# Patient Record
Sex: Male | Born: 1945 | Race: White | Hispanic: No | Marital: Married | Smoking: Never smoker
Health system: Southern US, Community
[De-identification: ages and names within clinical notes are randomized; demographics above are authoritative.]

## PROBLEM LIST (undated history)

## (undated) DIAGNOSIS — Z45018 Encounter for adjustment and management of other part of cardiac pacemaker: Secondary | ICD-10-CM

## (undated) DIAGNOSIS — I10 Essential (primary) hypertension: Secondary | ICD-10-CM

## (undated) DIAGNOSIS — Z950 Presence of cardiac pacemaker: Secondary | ICD-10-CM

## (undated) DIAGNOSIS — I480 Paroxysmal atrial fibrillation: Secondary | ICD-10-CM

## (undated) DIAGNOSIS — I495 Sick sinus syndrome: Secondary | ICD-10-CM

## (undated) DIAGNOSIS — M1990 Unspecified osteoarthritis, unspecified site: Secondary | ICD-10-CM

## (undated) HISTORY — DX: Sick sinus syndrome: I49.5

## (undated) HISTORY — DX: Presence of cardiac pacemaker: Z95.0

## (undated) HISTORY — DX: Paroxysmal atrial fibrillation: I48.0

## (undated) HISTORY — DX: Encounter for adjustment and management of other part of cardiac pacemaker: Z45.018

---

## 2000-10-04 ENCOUNTER — Encounter: Admission: RE | Admit: 2000-10-04 | Discharge: 2000-10-04 | Payer: Self-pay | Admitting: Otolaryngology

## 2000-10-04 ENCOUNTER — Encounter: Payer: Self-pay | Admitting: Otolaryngology

## 2003-03-15 ENCOUNTER — Encounter: Admission: RE | Admit: 2003-03-15 | Discharge: 2003-06-13 | Payer: Self-pay | Admitting: Family Medicine

## 2003-07-06 ENCOUNTER — Encounter: Admission: RE | Admit: 2003-07-06 | Discharge: 2003-10-04 | Payer: Self-pay | Admitting: Family Medicine

## 2004-02-04 ENCOUNTER — Ambulatory Visit (HOSPITAL_COMMUNITY): Admission: RE | Admit: 2004-02-04 | Discharge: 2004-02-04 | Payer: Self-pay | Admitting: Gastroenterology

## 2006-06-16 HISTORY — PX: JOINT REPLACEMENT: SHX530

## 2006-07-16 ENCOUNTER — Inpatient Hospital Stay (HOSPITAL_COMMUNITY): Admission: RE | Admit: 2006-07-16 | Discharge: 2006-07-19 | Payer: Self-pay | Admitting: Orthopedic Surgery

## 2006-08-27 ENCOUNTER — Observation Stay (HOSPITAL_COMMUNITY): Admission: RE | Admit: 2006-08-27 | Discharge: 2006-08-28 | Payer: Self-pay | Admitting: Orthopedic Surgery

## 2010-07-21 ENCOUNTER — Ambulatory Visit (HOSPITAL_COMMUNITY): Admission: RE | Admit: 2010-07-21 | Discharge: 2010-07-21 | Payer: Self-pay | Admitting: Orthopedic Surgery

## 2011-01-07 ENCOUNTER — Encounter: Payer: Self-pay | Admitting: Orthopedic Surgery

## 2011-02-08 ENCOUNTER — Other Ambulatory Visit: Payer: Self-pay | Admitting: Orthopedic Surgery

## 2011-02-08 ENCOUNTER — Other Ambulatory Visit (HOSPITAL_COMMUNITY): Payer: Self-pay | Admitting: Orthopedic Surgery

## 2011-02-08 ENCOUNTER — Ambulatory Visit (HOSPITAL_COMMUNITY)
Admission: RE | Admit: 2011-02-08 | Discharge: 2011-02-08 | Disposition: A | Discharge status: Home / Self Care | Payer: BC Managed Care – PPO | Source: Ambulatory Visit | Attending: Orthopedic Surgery | Admitting: Orthopedic Surgery

## 2011-02-08 ENCOUNTER — Encounter (HOSPITAL_COMMUNITY): Payer: BC Managed Care – PPO

## 2011-02-08 DIAGNOSIS — Y831 Surgical operation with implant of artificial internal device as the cause of abnormal reaction of the patient, or of later complication, without mention of misadventure at the time of the procedure: Secondary | ICD-10-CM | POA: Insufficient documentation

## 2011-02-08 DIAGNOSIS — Z01818 Encounter for other preprocedural examination: Secondary | ICD-10-CM

## 2011-02-08 DIAGNOSIS — I10 Essential (primary) hypertension: Secondary | ICD-10-CM | POA: Insufficient documentation

## 2011-02-08 DIAGNOSIS — Z79899 Other long term (current) drug therapy: Secondary | ICD-10-CM | POA: Insufficient documentation

## 2011-02-08 DIAGNOSIS — T84099A Other mechanical complication of unspecified internal joint prosthesis, initial encounter: Secondary | ICD-10-CM | POA: Insufficient documentation

## 2011-02-08 DIAGNOSIS — E119 Type 2 diabetes mellitus without complications: Secondary | ICD-10-CM | POA: Insufficient documentation

## 2011-02-08 DIAGNOSIS — Z01812 Encounter for preprocedural laboratory examination: Secondary | ICD-10-CM | POA: Insufficient documentation

## 2011-02-08 LAB — URINALYSIS, ROUTINE W REFLEX MICROSCOPIC
Bilirubin Urine: NEGATIVE
Hgb urine dipstick: NEGATIVE
Nitrite: NEGATIVE
Protein, ur: NEGATIVE mg/dL
Specific Gravity, Urine: 1.018 (ref 1.005–1.030)
Urine Glucose, Fasting: NEGATIVE mg/dL
Urobilinogen, UA: 0.2 mg/dL (ref 0.0–1.0)
pH: 5.5 (ref 5.0–8.0)

## 2011-02-08 LAB — CBC
HCT: 40.6 % (ref 39.0–52.0)
Hemoglobin: 13.9 g/dL (ref 13.0–17.0)
MCH: 30.1 pg (ref 26.0–34.0)
MCHC: 34.2 g/dL (ref 30.0–36.0)
MCV: 87.9 fL (ref 78.0–100.0)
Platelets: 162 10*3/uL (ref 150–400)
RBC: 4.62 MIL/uL (ref 4.22–5.81)
RDW: 12.6 % (ref 11.5–15.5)
WBC: 4 10*3/uL (ref 4.0–10.5)

## 2011-02-08 LAB — COMPREHENSIVE METABOLIC PANEL
AST: 20 U/L (ref 0–37)
CO2: 25 mEq/L (ref 19–32)
Calcium: 9.4 mg/dL (ref 8.4–10.5)
Chloride: 106 mEq/L (ref 96–112)
Creatinine, Ser: 1.07 mg/dL (ref 0.4–1.5)
GFR calc Af Amer: >60 mL/min (ref >60)
GFR calc non Af Amer: >60 mL/min (ref >60)
Glucose, Bld: 99 mg/dL (ref 70–99)
Total Bilirubin: 1 mg/dL (ref 0.3–1.2)

## 2011-02-08 LAB — PROTIME-INR: Prothrombin Time: 14.2 seconds (ref 11.6–15.2)

## 2011-02-08 LAB — SURGICAL PCR SCREEN: Staphylococcus aureus: INVALID — AB

## 2011-02-12 LAB — MRSA CULTURE

## 2011-02-14 DIAGNOSIS — I10 Essential (primary) hypertension: Secondary | ICD-10-CM | POA: Diagnosis present

## 2011-02-14 DIAGNOSIS — Z96659 Presence of unspecified artificial knee joint: Secondary | ICD-10-CM

## 2011-02-14 DIAGNOSIS — T84039A Mechanical loosening of unspecified internal prosthetic joint, initial encounter: Principal | ICD-10-CM | POA: Diagnosis present

## 2011-02-14 DIAGNOSIS — Y831 Surgical operation with implant of artificial internal device as the cause of abnormal reaction of the patient, or of later complication, without mention of misadventure at the time of the procedure: Secondary | ICD-10-CM | POA: Diagnosis present

## 2011-02-14 DIAGNOSIS — E119 Type 2 diabetes mellitus without complications: Secondary | ICD-10-CM | POA: Diagnosis present

## 2011-02-14 LAB — GRAM STAIN

## 2011-02-14 LAB — TYPE AND SCREEN
ABO/RH(D): A POS
Antibody Screen: NEGATIVE

## 2011-02-14 LAB — ABO/RH: ABO/RH(D): A POS

## 2011-02-14 LAB — GLUCOSE, CAPILLARY: Glucose-Capillary: 176 mg/dL — ABNORMAL HIGH (ref 70–99)

## 2011-02-15 LAB — CBC
MCH: 29.8 pg (ref 26.0–34.0)
MCV: 87.9 fL (ref 78.0–100.0)
Platelets: 175 10*3/uL (ref 150–400)
RDW: 12.3 % (ref 11.5–15.5)

## 2011-02-15 LAB — BASIC METABOLIC PANEL
BUN: 14 mg/dL (ref 6–23)
Calcium: 8.2 mg/dL — ABNORMAL LOW (ref 8.4–10.5)
Creatinine, Ser: 1.04 mg/dL (ref 0.4–1.5)
GFR calc Af Amer: >60 mL/min (ref >60)
GFR calc non Af Amer: >60 mL/min (ref >60)

## 2011-02-16 LAB — BASIC METABOLIC PANEL
Chloride: 102 mEq/L (ref 96–112)
Creatinine, Ser: 1.04 mg/dL (ref 0.4–1.5)
GFR calc Af Amer: >60 mL/min (ref >60)
Potassium: 4.9 mEq/L (ref 3.5–5.1)
Sodium: 136 mEq/L (ref 135–145)

## 2011-02-16 LAB — CBC
MCH: 29.7 pg (ref 26.0–34.0)
Platelets: 153 10*3/uL (ref 150–400)
RBC: 3.57 MIL/uL — ABNORMAL LOW (ref 4.22–5.81)
WBC: 9.6 10*3/uL (ref 4.0–10.5)

## 2011-02-17 LAB — BODY FLUID CULTURE

## 2011-02-17 LAB — CBC
MCV: 89 fL (ref 78.0–100.0)
Platelets: 168 10*3/uL (ref 150–400)
RBC: 3.54 MIL/uL — ABNORMAL LOW (ref 4.22–5.81)
WBC: 7.8 10*3/uL (ref 4.0–10.5)

## 2011-02-19 LAB — ANAEROBIC CULTURE

## 2011-02-22 NOTE — Op Note (Signed)
NAME:  [name], [name] NO.:  [id number]  MEDICAL RECORD NO.:  [id number]           PATIENT TYPE:  I  LOCATION:  0005                         FACILITY:  [facility]  PHYSICIAN:  Ollen Gross, M.D.    DATE OF BIRTH:  [birth year]  DATE OF PROCEDURE:  02/14/2011 DATE OF DISCHARGE:                              OPERATIVE REPORT   PREOPERATIVE DIAGNOSIS:  Unstable right total knee arthroplasty.  POSTOPERATIVE DIAGNOSIS:  Unstable right total knee arthroplasty.  PROCEDURE:  Right total knee arthroplasty revision.  SURGEON:  Ollen Gross, M.D.  ASSISTANT:  Alexzandrew L. Perkins, P.A.C.  ANESTHESIA:  General.  ESTIMATED BLOOD LOSS:  Minimal.  DRAIN:  Hemovac x1.  TOURNIQUET TIME:  53 minutes at 300 mmHg, down 10 minutes, up additional 17 minutes at 300 mmHg.  COMPLICATIONS:  None.  CONDITION:  Stable to recovery room.  CLINICAL NOTE:  [name] is a 65 year old male with an unstable right total knee arthroplasty which is also extremely stiff.  There is flexion of only about 70 degrees.  He has had a CBC, sed rate and C- reactive protein which were within normal limits.  He has had a bone scan showing loosening of the components.  He presents now for revision of the polyethylene versus total knee arthroplasty revision.  PROCEDURE IN DETAIL:  After successful administration of general anesthetic, a tourniquet was placed high on his right thigh and his right lower extremity was prepped and draped in the usual sterile fashion.  Extremity was wrapped in Esmarch.  Tourniquet inflated to 300 mmHg.  Midline incision was made with 10 blade through subcutaneous tissue to the level of the extensor mechanism.  Fresh blade was used to make a medial parapatellar arthrotomy.  Minimal fluid was encountered and this fluid was sent for stat Gram stain which was negative.  I excised a lot of scar from under the extensor mechanism both medially and laterally.  I then elevated  the soft tissue off the proximal medial tibia to the joint line with a knife and into the semimembranosus bursa with a Cobb elevator.  Laterally, he was scarred down quite a bit, and I removed most of the fat pad.  Patella still would not even sublux.  I had to do quadriceps snip in order to even get the patella to sublux.  I performed that and then subluxed the patella laterally.  His polyethylene showed a fair amount of wear in the intercondylar region. I felt that the overall clinical picture was not consistent with one that would respond just to polyethylene revision as he was loose in extension and tight in flexion.  We removed the tibial polyethylene.  I then inspected the femoral component and there appeared to be some gap between the component and the bone.  I then used an osteotome to disrupt the interface between the component bone and easily removed the component with minimal if any bone loss.  There was a large lytic area in the intercondylar notch.  I removed the membrane from this, and it really expanded the intercondylar notch quite a bit.  Fortunately there was no evidence of  any segmental bone loss.  We then subluxed the tibia forward and gained exposure to the proximal tibia.  I used an oscillating saw to disrupt the interface between the tibial component bone and tibial components and easily removed with no bone loss.  I removed the cement from the tibial canal.  The canals were both thoroughly irrigated and then reamed on the femoral side up to 20 mm, tibial side 13 mm.  Retractors were again placed around the tibia and then the extramedullary tibial cutting guide was placed.  I removed about 2 mm from the tibial surface to make it a flat surface again.  We then prepared with the sleeve, up to 37-mm sleeve. The proximal tibia was then prepared with the modular drill and keel punch.  A trial size 5 MBT revision tray with a 13 x 30 stem extension and 37 sleeve was  placed with excellent purchase.  I then prepared the femur.  A 20-mm reamer was placed to serve as intramedullary cutting guide.  Distal femoral cutting block was placed to remove minimal bone from the distal femur.  I had to go up in a +4 position and thus we used 4-mm distal augments both medial and lateral. We then placed the size 5 cutting block.  Rotation was marked by creating a rectangular flexion gap at 90 degrees with spacer block.  We placed the stem in a +2 position to effectively raise the stem and lower the flange of the prosthesis down to the anterior cortex of the femur. The anterior, posterior and chamfer cuts were made.  We had to use 4-mm augments posteriorly both medial and lateral.  Intercondylar blocks were placed and cuts made with TC3.  The trial was then placed which was a size 5 TC3 femur with 18 x 75 stem extension and +2 position, 4 mm augments medial and lateral distally and posteriorly.  Trial had excellent fit.  We placed a 17.5-mm insert.  There was a little bit of play with 17.5, went to 20 which had great stability with full extension and excellent varus-valgus and anterior-posterior balance throughout full range of motion.  The patella was intact but there was lot of soft tissue covering it.  I essentially did patelloplasty to remove the soft tissue and allow for the component to track within the trochlea.  It tracked normally.  Tourniquet was then let down for initial tourniquet time of 53 minutes.  We held it down for 10 minutes while the components were assembled on the back table.  Once the components were assembled, then we rewrapped the extremity in Esmarch and reinflated tourniquet to 300 mmHg.  I then removed the trials and the size 5 cement restrictor was most appropriate for the tibial canal.  The size 5 cement restrictor was placed and then the cut bone surfaces were prepared with pulsatile lavage.  Cement was mixed and once ready for  implantation, the gentamicin impregnated cement was injected into the tibial canal and the tibial component was placed.  This was a size 5 MBT revision tray with a 13 x 30 stem extension and 37 sleeve.  We then impacted the femoral component which was a size 5 TC3 femur with 20 x 75 stem extension and +2 position with 4-mm augments medial and lateral both distal and posterior.  Once it impacted, then we placed a 20-mm trial insert.  Full extension was achieved and the knee was held in full extension.  All extruded cement removed.  When  cement was fully hardened, then the permanent 20-mm TC3 tibial polyethylene was placed.  This was a Hydrographic surveyor.  Wound was then copiously irrigated with saline solution and then the arthrotomy and quad snip closed over the Hemovac drain with interrupted #1 PDS.  Flexion against gravity was about 115 degrees. Tourniquet was then released.  Second tourniquet time was 17 minutes. Subcu was closed with interrupted 2-0 Vicryl and skin with staples. Catheter for the Marcaine pain pump was placed and the pump was initiated.  Incisions were cleaned and dried.  A bulky sterile dressing applied.  Drains hooked to suction.  He was placed into a knee immobilizer, awakened and transported to recovery in stable condition.     Ollen Gross, M.D.     FA/MEDQ  D:  02/14/2011  T:  02/14/2011  Job:  [id number]  Electronically Signed by Ollen Gross M.D. on 02/21/2011 03:47:08 PM

## 2011-03-07 NOTE — H&P (Signed)
NAME:  [name], [name] NO.:  [id number]  MEDICAL RECORD NO.:  [id number]          PATIENT TYPE:  INP  LOCATION:                               FACILITY:  [facility]  PHYSICIAN:  Ollen Gross, M.D.    DATE OF BIRTH:  1946-07-18  DATE OF ADMISSION: DATE OF DISCHARGE:                             HISTORY & PHYSICAL   DATE OF ADMISSION:  February 14, 2011.  CHIEF COMPLAINT:  Right knee pain.  HISTORY OF PRESENT ILLNESS:  The patient is a 65 year old male who has been seen by Dr. Lequita Halt for problems ongoing with his right knee, had a right total knee done about 4 and half years ago.  Unfortunately, he continues to have pain and problems with the right knee.  He was seen in the second opinion this past July, 2011.  He initially said he did well after his total knee replacement but then started having problems.  He was sent to the hospital for possible infection and received antibiotics for this.  Over time unfortunately, the knee has progressively gotten worse and developed stiffness.  He has been seen in the office and found to have x-ray showing a well fixed DePuy rotating platform.  He has a little bit of lucency on the posterior aspect of femoral condyles and tiny area underneath the tibial tray, is questionable whether these are progressive.  He possibly could have a low-grade infection versus loosening due to increased stress on the implant.  It is felt at this point he would benefit from undergoing revision versus resection.  Risks and benefits have been discussed.  He elected to proceed with surgery.  ALLERGIES:  No known drug allergies.  CURRENT MEDICATIONS:  Lipitor, lisinopril, vitamin D, baby aspirin, multivitamin, fish oil, glucosamine chondroitin, turmeric and lysine.  PAST MEDICAL HISTORY: 1. Hypertension. 2. Diet-controlled diabetes mellitus.  PREVIOUS SURGERIES:  Right total knee arthroplasty, July 16, 2006.  FAMILY HISTORY:  Father with heart  disease, hypertension.  Mother with heart disease, cancer and hypertension.  SOCIAL HISTORY:  Married, retired, nonsmoker.  Rare intake of wine. Three children.  His wife will be assisting with care after surgery.  He does have 3 steps entering his home and does have healthcare power of attorney.  REVIEW OF SYSTEMS:  GENERAL:  No fevers, chills or night sweats.  NEURO: No seizures, syncope or paralysis.  RESPIRATORY:  No shortness breath, productive cough or hemoptysis.  CARDIOVASCULAR:  No chest pain or orthopnea.  GI:  No nausea, vomiting, diarrhea, or constipation.  GU: No dysuria, hematuria, or discharge.  MUSCULOSKELETAL:  Right knee.  PHYSICAL EXAMINATION:  VITAL SIGNS:  Pulse ranging about 56-60, respirations 12, blood pressure 138/72. GENERAL:  A 65 year old tall frame white male well-nourished, well- developed, in no acute distress.  He is alert, oriented and cooperative. Good historian. HEENT:  Normocephalic, atraumatic.  Pupils are round and reactive.  EOMs intact.  Does have reading glasses on.  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 NECK:  Supple. CHEST:  Clear. HEART:  Bradycardic rhythm, otherwise regular  S1 and S2 noted. ABDOMEN:  Soft, slightly round.  Bowel sounds present. RECTAL, BREASTS, GENITALIA:  Not done, not pertinent to present illness. EXTREMITIES:  Right lower extremity, slight effusion with the knee endpoints.  Range of motion is 5 to 85 with firm end point.  No instability or antalgic gait.  IMPRESSION:  Failure right total knee arthroplasty.  PLAN:  Revision versus resection right total knee arthroplasty.  Surgery will be performed by Dr. Ollen Gross.     Alexzandrew L. Julien Girt, P.A.C.   ______________________________ Ollen Gross, M.D.    ALP/MEDQ  D:  02/13/2011  T:  02/13/2011  Job:  [id number]  cc:   Marjory Lies, M.D. Fax: [phone]  Electronically Signed by Patrica Duel  P.A.C. on 02/22/2011 10:36:18 AM Electronically Signed by Ollen Gross M.D. on 03/07/2011 08:17:22 AM

## 2011-03-21 NOTE — Discharge Summary (Signed)
NAME:  [name], [name] NO.:  [id number]  MEDICAL RECORD NO.:  [id number]           PATIENT TYPE:  I  LOCATION:  1612                         FACILITY:  [facility]  PHYSICIAN:  Ollen Gross, M.D.    DATE OF BIRTH:  1946/11/22  DATE OF ADMISSION:  02/14/2011 DATE OF DISCHARGE:  02/17/2011                              DISCHARGE SUMMARY   ADMITTING DIAGNOSES: 1. Failure right total knee arthroplasty. 2. Hypertension. 3. Diet-controlled diabetes mellitus.  DISCHARGE DIAGNOSES: 1. Unstable failed right total knee arthroplasty, status post revision     right total knee. 2. Postop hyponatremia. 3. Hypertension. 4. Diet-controlled diabetes mellitus.  PROCEDURE:  February 14, 2011, right total knee arthroplasty revision. Surgeon, Ollen Gross, M.D.  Assistant, Alexzandrew L. Perkins, P.A.C. Anesthesia, general.  Tourniquet time, 53 minutes and down for 10 minutes, up an additional 17 minutes.  CONSULTS:  None.  BRIEF HISTORY:  The patient is a 65 year old male with unstable right total knee arthroplasty, extremely stiff.  He has had flexion only about 70 degrees.  Laboratory workup for infection has been within normal limits.  Bone scan did show loosening though and he presents now for a polyethylene revision versus total knee revision.  LABORATORY DATA:  CBC on admission showed a hemoglobin of 13.9, hematocrit of 40.6, white cell count 4.0, platelets 162.  PT/INR 14.2 and 1.08 with a PTT of 30.  Chem panel on admission all within normal limits.  Preop UA, trace ketones, otherwise negative.  Nasal swabs were negative for Staph aureus, negative for MRSA.  Blood group type A+. Serial CBCs were followed.  Hemoglobin dropped down to 11 and 10.6. Last hemoglobin 10.4 and hematocrit 31.5.  Serial BMETs were followed. Sodium did drop down to 133, back up to 136; glucose went up to 186, back down to 140.  Remaining electrolytes remained within normal limits. Stat Gram  stain taken at the time of surgery, no organisms, moderate WBCs.  Fluid culture taken.  Smear showed no organisms, no growth. Anaerobic culture smear showed no organism, no anaerobes isolated.  HOSPITAL COURSE:  The patient admitted to [facility], taken to OR, underwent above-stated procedure without complication.  The patient tolerated procedure well, later transferred to the recovery room from the orthopedic floor, started on p.o. and IV analgesic for pain control following surgery, doing pretty well on the morning of day 1.  He was tolerating his medications.  We discontinued the IV fluids and put him on Xarelto protocol for DVT prophylaxis.  By day 2, he was progressing up with therapy.  Hemoglobin was stable.  Dressing was changed, incision looked good, pressure looked good, and continue to progress well with therapy and by day 3, he was tolerating his medications, meeting his goals and discharged home.  DISCHARGE PLAN: 1. The patient discharged home on February 17, 2011. 2. Discharge diagnoses, please see above. 3. Discharge medications, Robaxin, Percocet, Xarelto.  Continue home     medications of aspirin, Lipitor, and lisinopril.  DIET:  Heart-healthy diet.  ACTIVITY:  Weightbearing as tolerated, total hip protocol.  DISPOSITION:  Home.  CONDITION ON DISCHARGE:  Improved.  Alexzandrew L. Julien Girt, P.A.C.   ______________________________ Ollen Gross, M.D.    ALP/MEDQ  D:  03/15/2011  T:  03/16/2011  Job:  [id number]  cc:   Marjory Lies, M.D. Fax: [phone]  Electronically Signed by Patrica Duel P.A.C. on 03/21/2011 07:19:01 AM Electronically Signed by Ollen Gross M.D. on 03/21/2011 09:53:39 AM

## 2011-03-30 IMAGING — CR DG CHEST 2V
2 series · 2 of 2 positions shown · non-contrast
Comparison: 07/09/2006

CLINICAL DATA: Preop right total knee arthroplasty.

CHEST - 2 VIEW

[w chest pa]
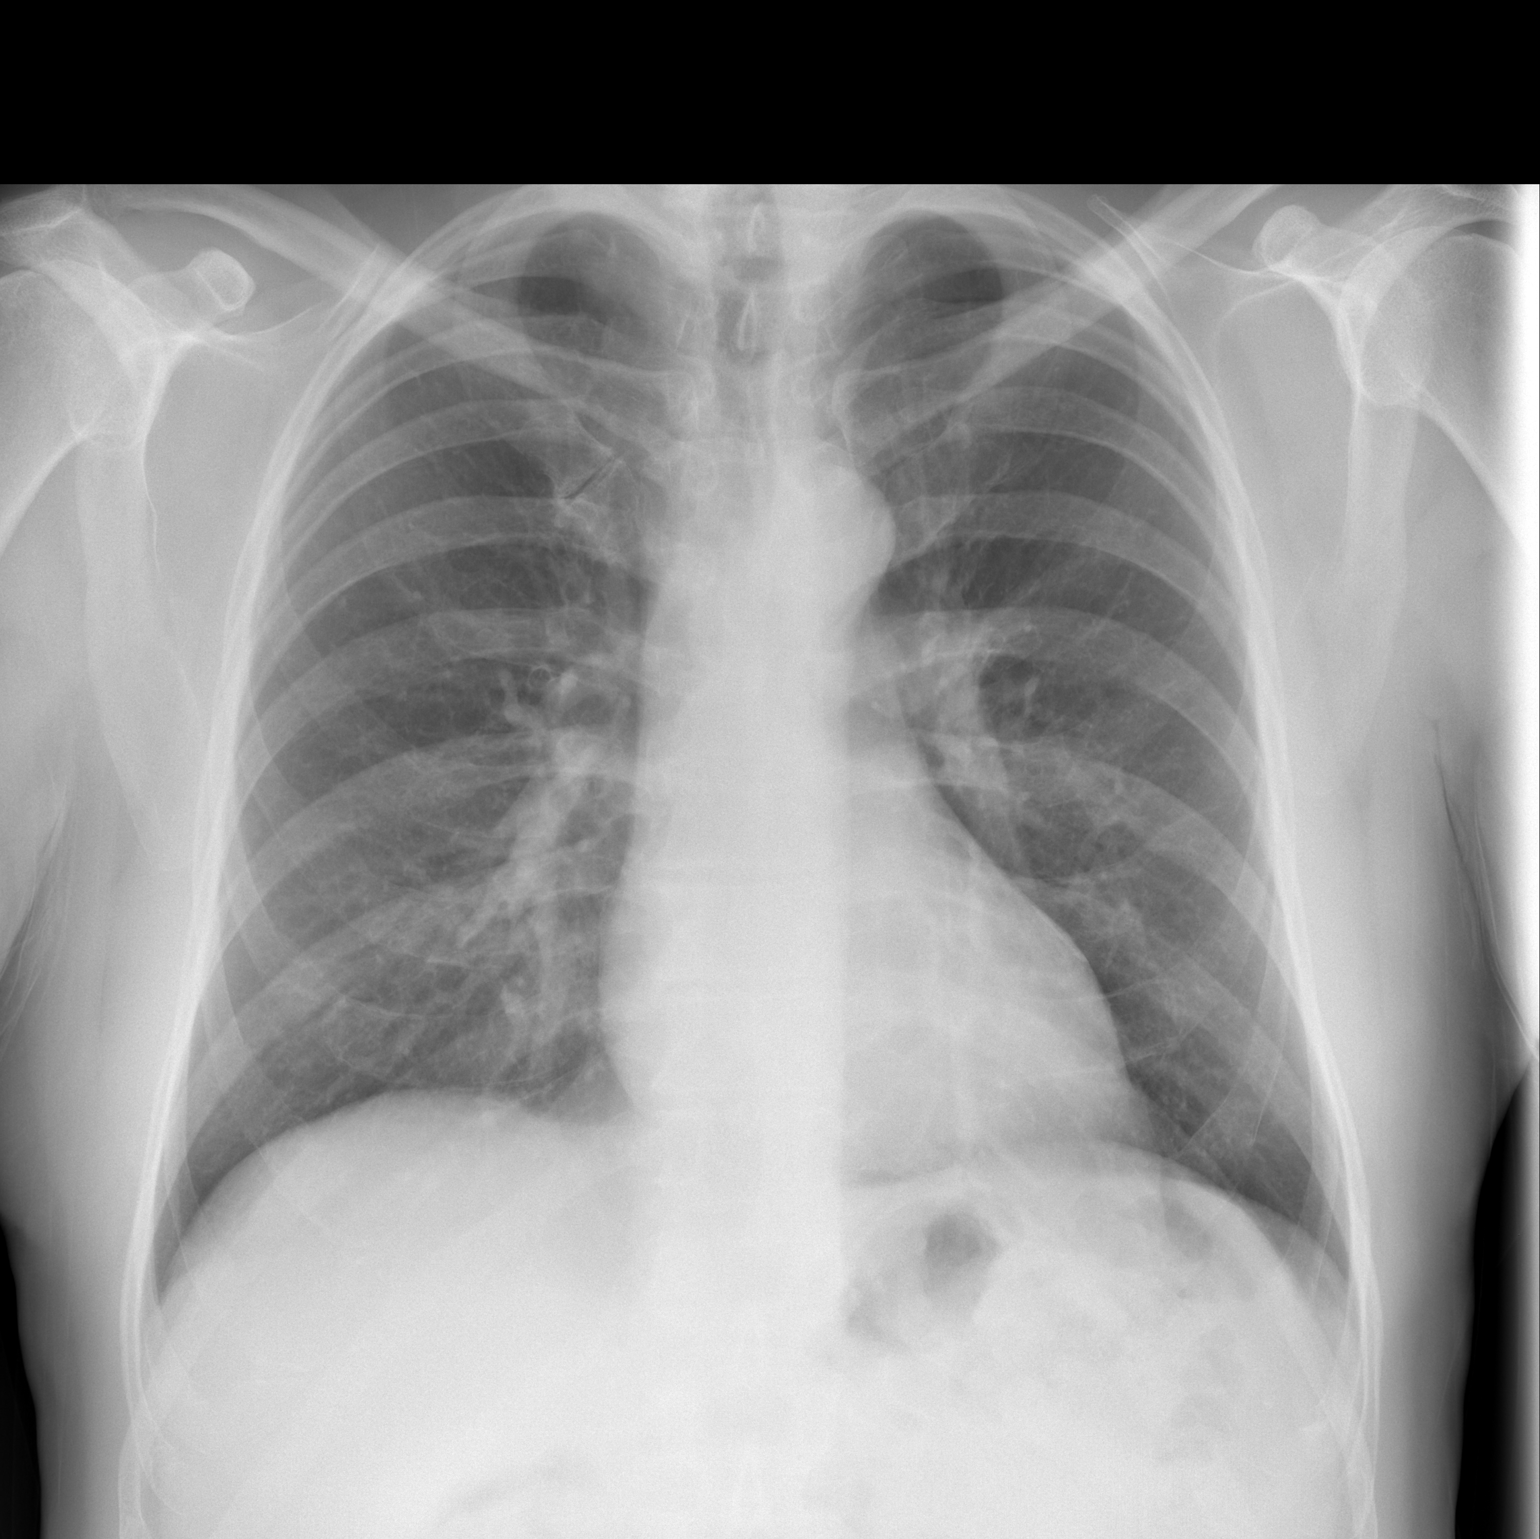

[w chest lat]
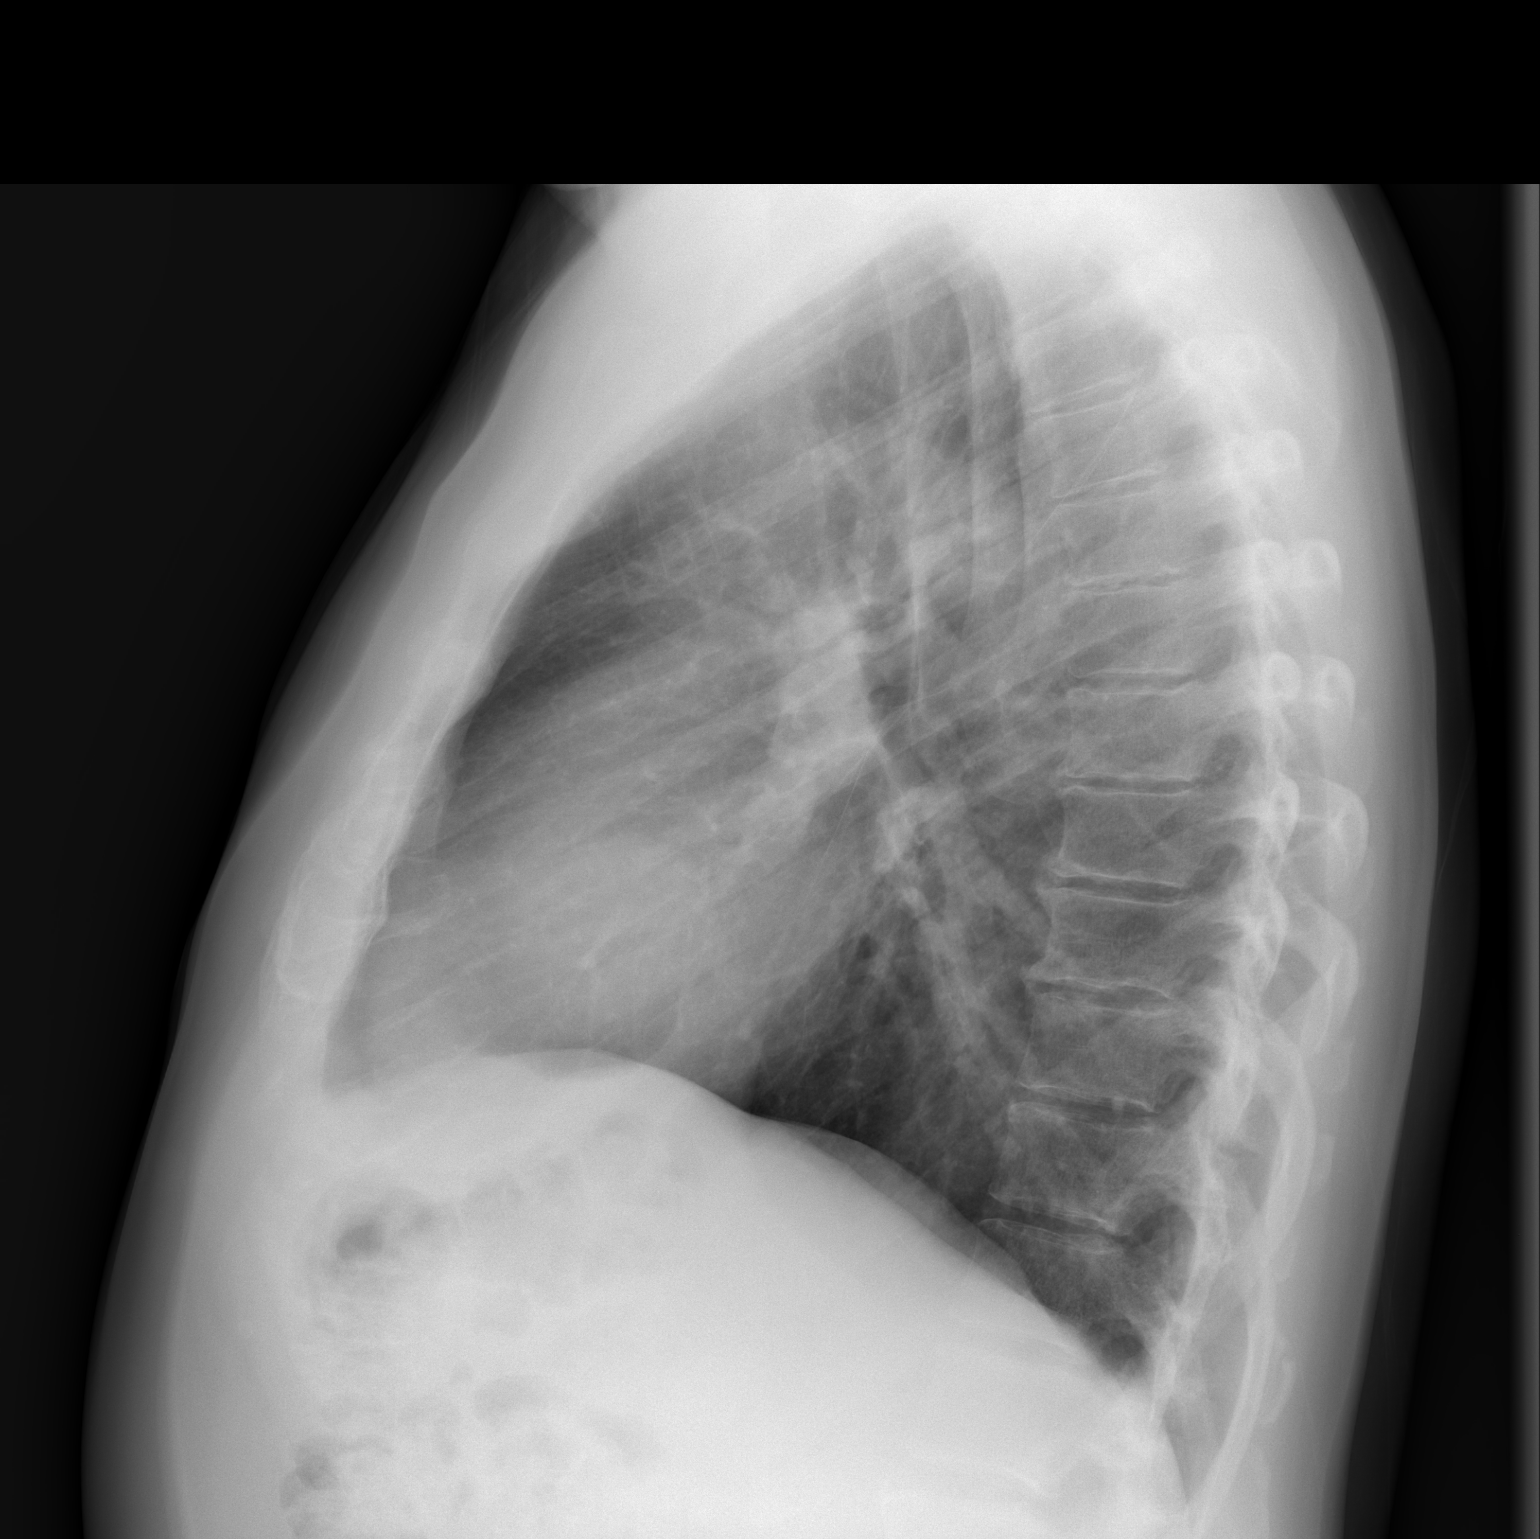

[2 of 2 positions shown; findings below may reference images not displayed]

FINDINGS: Lungs are clear.

No pleural effusion or pneumothorax.

Cardiomediastinal silhouette is within normal limits.

Visualized osseous structures are within normal limits.
IMPRESSION: No evidence of acute cardiopulmonary disease.

## 2011-05-04 NOTE — Op Note (Signed)
NAME:  [name], [name] NO.:  [id number]   MEDICAL RECORD NO.:  [id number]                   PATIENT TYPE:  AMB   LOCATION:  ENDO                                 FACILITY:  [facility]   PHYSICIAN:  Anselmo Rod, M.D.               DATE OF BIRTH:  February 22, 1946   DATE OF PROCEDURE:  02/04/2004  DATE OF DISCHARGE:                                 OPERATIVE REPORT   PROCEDURE:  Screening colonoscopy.   ENDOSCOPIST:  Charna Elizabeth, M.D.   INSTRUMENT USED:  Olympus video colonoscope.   INDICATIONS FOR PROCEDURE:  65 year old white male with a family history of  colon cancer in his father is undergoing screening colonoscopy to rule out  colonic polyps, masses, etc.   PREPROCEDURE PREPARATION:  Informed consent was obtained from the patient.  The patient was fasted for eight hours prior to the procedure and prepped  with a bottle of magnesium citrate and a gallon of GoLYTELY the night prior  to the procedure.   PREPROCEDURE PHYSICAL:  Patient with stable vital signs.  Neck supple.  Chest clear to auscultation.  S1 and S2 regular.  Abdomen soft with normal  bowel sounds.   DESCRIPTION OF PROCEDURE:  The patient was placed in the left lateral  decubitus position, sedated with 50 mg of Demerol and 6 mg Versed  intravenously.  Once the patient was adequately sedated, maintained on low  flow oxygen and continuous cardiac monitoring, the Olympus video colonoscope  was advanced from the rectum to the cecum.  There was some residual stool in  the colon, multiple washings were done, a few early sigmoid diverticula were  seen, small internal hemorrhoids were noted, no other masses or polyps were  identified.   IMPRESSION:  1. Early sigmoid diverticulosis.  2. Small internal hemorrhoids.  3. No masses or polyps seen.   RECOMMENDATIONS:  1. Continue a high fiber diet with liberal fluid intake.  2. Repeat CRC screening in the next five years unless the patient  develops     any abnormal symptoms in the interim.  3. Outpatient follow up as the need arises in the future.                                               Anselmo Rod, M.D.    JNM/MEDQ  D:  02/04/2004  T:  02/05/2004  Job:  [id number]   cc:   Duwayne Heck L. Mahaffey, M.D.  [address]  [place]  [place] [address]  Fax: [phone]

## 2011-05-04 NOTE — Discharge Summary (Signed)
NAME:  [name], [name] NO.:  [id number]   MEDICAL RECORD NO.:  [id number]          PATIENT TYPE:  INP   LOCATION:  5034                         FACILITY:  [facility]   PHYSICIAN:  Elana Alm. Thurston Hole, M.D. 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  2. Hypertension.  3. High cholesterol.  4. Diet controlled diabetes.   DISCHARGE DIAGNOSIS:  1. 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  2. Hypertension.  3. High cholesterol.  4. Diet controlled diabetes.   HISTORY OF PRESENT ILLNESS:  The patient is a 65 year old white male with a  history of end stage DJD of his right knee.  He has failed conservative care  including anti-inflammatories and physical therapy.  He understands the  risks, benefits, and possible complications of a right total knee  replacement and is without question.   PROCEDURES IN HOUSE:  On July 16, 2006, the patient underwent a right total  knee replacement by Dr. Thurston Hole and a right femoral nerve block by  anesthesia.   HOSPITAL COURSE:  He was admitted postoperatively for pain control, physical  therapy and DVT prophylaxis. On postop day zero, he had a moderate amount of  drainage from his hemostatic and, therefore, it was reclamped. Postop day  one, hemoglobin 11.7, CBG 135, he is metabolically stable with a moderate  amount of drainage.  He tolerated CPM 0 to 90 degrees with minimal pain.  His dressing was changed.  His Foley was discontinued.  His PCA was  discontinued.  He was started on Percocet for pain.  On postop day two, his  is approximately a 3, T-max of 101.1, temperature on exam was 98.9, vital  signs were stable.  Hemoglobin and 9.8.  INR is 1.9.  He has a small amount  of sanguinous drainage on his dressing.  He has positive bowel sounds.  His  lungs  were clear.  He is tolerating 0 to 90 on the CPM. He is  neurovascularly intact with a brisk capillary refill. Intact sensation and  motor functions.  His fever of 101 improved with incentive she spirometry  and Tylenol. His IV was saline locked and TED hose were applied.  Postop day  three, T-max of 100.4, hemoglobin 9.6, INR is 2.  He is metabolically  stable.  Surgical wound is well-approximated.  He does have a slight bit of  redness.  CPM series 0 to 90 degrees.  He has brisk capillary refill. He is  being discharged to home in stable condition, weight bearing as tolerated,  on his regular diabetic diet.   DISCHARGE MEDICATIONS:  Percocet 1-2 q.4-6h. p.r.n. pain, doxycycline 100 mg  1 tablet twice a day for ten days, Coumadin 5 mg 1 tablet daily, Lisinopril  20 mg 1 tablet daily, aspirin 81 mg 1 tablet daily, Lipitor 20 mg 1 tablet  daily, Colace 100 mg 1 tablet twice a day, Senokot-S 2 tablets before  dinner.   DISCHARGE INSTRUCTIONS:  He has been instructed to use his CPM 0 to 90  degrees eight hours a day, elevate his right knee on a folded pillow 30  minutes a day.  He will get home health physical therapy, occupational  therapy, and nursing.  He will follow up with Dr. Thurston Hole on August 14.  He  will call with increased redness, increased swelling, increased drainage,  increased pain or temperature greater than 101.      Kirstin Shepperson, P.A.      Robert A. Thurston Hole, M.D.  Electronically Signed    KS/MEDQ  D:  08/21/2006  T:  08/21/2006  Job:  [id number]   cc:   Duwayne Heck L. Mahaffey, M.D.

## 2011-05-04 NOTE — Op Note (Signed)
NAME:  [name], [name] NO.:  [id number]   MEDICAL RECORD NO.:  [id number]          PATIENT TYPE:  INP   LOCATION:  2550                         FACILITY:  [facility]   PHYSICIAN:  Robert A. Thurston Hole, M.D. DATE OF BIRTH:  [birth year]   DATE OF PROCEDURE:  07/16/2006  DATE OF DISCHARGE:                                 OPERATIVE REPORT   PREOPERATIVE DIAGNOSIS:  His right knee degenerative joint disease.   POSTOPERATIVE DIAGNOSIS:  His right knee degenerative joint disease.   PROCEDURE:  Right total knee replacement using DePuy cemented total knee  system with #5 cemented femur, #5 cemented tibia with 10 mm polyethylene RP  tibial spacer, and 38 mm polyethylene cemented patella.   SURGEON:  Dr. Salvatore Marvel   ASSISTANT:  Julien Girt, PA   ANESTHESIA:  General.   OPERATIVE TIME:  1 hour 40 minutes.   COMPLICATIONS:  None.   DESCRIPTION OF PROCEDURE:  Mr. [name] was brought to operating room on  July 16, 2006, after a femoral nerve block was placed in the holding room by  anesthesia.  He is placed on the operative table supine position.  He  received Ancef 1 g IV preoperatively for prophylaxis.  After being placed  under general anesthesia, he had a Foley catheter placed under sterile  conditions.  His right knee was examined.  Range of motion from -5 to 125  degrees with varus deformity and a stable ligamentous exam with normal  patellar tracking.  The right leg was prepped using sterile DuraPrep and  draped using sterile technique.  Leg was exsanguinated and a thigh  tourniquet elevated 365 mmHg.  Initially through a 15 cm longitudinal  incision based over the patella, initial exposure was made.  Subcutaneous tissues were incised in line with the skin incision.  A median  arthrotomy was performed revealing an excessive amount of normal-appearing  joint fluid.  The articular surfaces were inspected.  He had grade 4 changes  medially, grade 3 and 4 changes  laterally, and grade 3 and 4 four changes in  the patellofemoral joint.  Osteophytes were removed from the femoral  condyles and tibial plateau.  The medial and lateral meniscal remnants were  removed as well as the anterior cruciate ligament.  An intramedullary drill  was then drilled up the femoral canal for placement of the distal femoral  cutting jig which was placed in the appropriate amount of rotation, and the  distal 12 mm cut was made.  The distal femur was incised.  A #5 was found to  be the appropriate size.  A #5 cutting jig was placed, and then these cuts  were made.  Proximal tibia was then exposed.  The tibial spines were removed  with an oscillating saw.  Intramedullary drill was drilled down the tibial  canal for placement of the proximal tibial cutting jig which was placed in  the appropriate amount of rotation, and a proximal 8 mm cut was made based  off the medial or lower side.  Spacer blocks were then placed, 10 mm block  in flexion and extension,  found to give excellent balancing, excellent  stability, and excellent correction of his flexion and varus deformities.  At this point, the #5 tibial baseplate was placed on the cut tibial surface,  and in the keel cut was made.  A #5 PCL box cutter was placed on the distal  femur, and then these cuts were made.  At this point, the #5 femoral trial  was placed and with the #5 tibial base plate trial, a 10 mm polyethylene RP  tibial spacer was placed on tibial baseplate.  The knee reduced, taken  through range of motion from 0-125 degrees with excellent stability and  excellent correction of his flexion and varus deformities.  At this point,  the patella was sized.  A resurfacing 10 mm cut was made, and 3 locking  holes were placed for a 38 mm patella.  The trial was placed.  Patellofemoral tracking was evaluated and this was found to be normal.  At  this point, it was felt that all the components were of excellent size, fit,   and stability.  They were then removed.  The knee was then jet lavage  irrigated with 3 liters of saline solution.  The proximal tibia was then  exposed, and the #5 tibial baseplate with cement backing was hammered into  position with an excellent fit with excess cement being removed from around  the edges.  #5 femoral component with cement backing was hammered into  position also with an excellent fit with excess cement being removed around  the edges.  The 10 mm polyethylene RP tibial spacer was placed on the tibial  baseplate and the knee reduced, taken through a range of motion from 0-125  degrees with excellent stability and excellent correction of his flexion and  varus deformities.  A 38 mm polyethylene cement backed patella was then  placed in its position and held there with a clamp.  After the cement  hardened, patellofemoral tracking was again evaluated.  This was found to be  normal.  At this point, it was felt that all components were excellent size,  fit, and stability.  The knee was further irrigated with saline, then the  tourniquet was released.  Hemostasis was obtained with cautery.  The  arthrotomy was then closed with #1 Ethilon suture over 2 medium Hemovac  drains.  Subcutaneous tissues closed with 0 and 2-0 Vicryl, subcuticular  layer closed with 3-0 Monocryl.  Steri-Strips were applied.  Sterile  dressings were applied.  The Hemovac injected with 0.25% Marcaine with  epinephrine and 4 mg morphine and clamp.  The patient then awakened,  extubated, and taken to the recovery room in stable condition.  Needle and  sponge counts were correct x2 at the end of the case.      Robert A. Thurston Hole, M.D.  Electronically Signed     RAW/MEDQ  D:  07/16/2006  T:  07/16/2006  Job:  [id number]

## 2011-05-09 ENCOUNTER — Ambulatory Visit (HOSPITAL_BASED_OUTPATIENT_CLINIC_OR_DEPARTMENT_OTHER)
Admission: RE | Admit: 2011-05-09 | Discharge: 2011-05-09 | Disposition: A | Discharge status: Home / Self Care | Payer: Medicare Other | Source: Ambulatory Visit | Attending: Orthopedic Surgery | Admitting: Orthopedic Surgery

## 2011-05-09 DIAGNOSIS — T8489XA Other specified complication of internal orthopedic prosthetic devices, implants and grafts, initial encounter: Secondary | ICD-10-CM | POA: Insufficient documentation

## 2011-05-09 DIAGNOSIS — Y831 Surgical operation with implant of artificial internal device as the cause of abnormal reaction of the patient, or of later complication, without mention of misadventure at the time of the procedure: Secondary | ICD-10-CM | POA: Insufficient documentation

## 2011-05-09 DIAGNOSIS — Z96659 Presence of unspecified artificial knee joint: Secondary | ICD-10-CM | POA: Insufficient documentation

## 2011-05-09 DIAGNOSIS — E119 Type 2 diabetes mellitus without complications: Secondary | ICD-10-CM | POA: Insufficient documentation

## 2011-05-09 DIAGNOSIS — Z79899 Other long term (current) drug therapy: Secondary | ICD-10-CM | POA: Insufficient documentation

## 2011-05-09 DIAGNOSIS — E785 Hyperlipidemia, unspecified: Secondary | ICD-10-CM | POA: Insufficient documentation

## 2011-05-09 DIAGNOSIS — I10 Essential (primary) hypertension: Secondary | ICD-10-CM | POA: Insufficient documentation

## 2011-05-09 DIAGNOSIS — M24669 Ankylosis, unspecified knee: Secondary | ICD-10-CM | POA: Insufficient documentation

## 2011-05-09 LAB — POCT I-STAT 4, (NA,K, GLUC, HGB,HCT)
Glucose, Bld: 119 mg/dL — ABNORMAL HIGH (ref 70–99)
HCT: 47 % (ref 39.0–52.0)

## 2011-05-19 NOTE — Op Note (Signed)
  NAME:  [name], [name] NO.:  [id number]  MEDICAL RECORD NO.:  [id number]           PATIENT TYPE:  LOCATION:                                 FACILITY:  PHYSICIAN:  Ollen Gross, M.D.    DATE OF BIRTH:  1946-02-05  DATE OF PROCEDURE:  05/09/2011 DATE OF DISCHARGE:                              OPERATIVE REPORT   PREOPERATIVE DIAGNOSIS:  Right knee arthrofibrosis.  POSTOPERATIVE DIAGNOSIS:  Right knee arthrofibrosis.  PROCEDURE:  Right knee closed manipulation.  SURGEON:  Ollen Gross, M.D.  ASSISTANT:  None.  ANESTHESIA:  General.  COMPLICATIONS:  None.  PRE-MANIPULATION RANGE OF MOTION:  5-90.  POST MANIPULATION RANGE OF MOTION:  0-125.  CONDITION:  Stable to Recovery.  BRIEF CLINICAL NOTE:  [name] is a 66 year old male, who had a revision total knee arthroplasty done approximately two and half months ago.  He did well initially, but then he has had plateau in his range of motion with flexion at 90 degrees.  He has worked with physical therapy with inability to get beyond 90.  He presents now for closed manipulation.  PROCEDURE IN DETAIL:  After successful administration of general anesthetic, exam under anesthesia was performed with range of motion 5- 90.  I then placed my chest up against his proximal tibia and flexed the knee with audible lysis of adhesions.  I was able to get all the back to about 125 degrees of flexion.  We were able to obtain full extension.  I also worked on patellar mobility to make the patella more mobile.  We flexed again usually to 125.  He subsequently awakened and transported to Recovery in stable condition.     Ollen Gross, M.D.     FA/MEDQ  D:  05/09/2011  T:  05/09/2011  Job:  [id number]  Electronically Signed by Ollen Gross M.D. on 05/19/2011 04:05:35 PM

## 2011-09-11 ENCOUNTER — Other Ambulatory Visit: Payer: Self-pay | Admitting: Orthopedic Surgery

## 2011-09-11 ENCOUNTER — Encounter (HOSPITAL_COMMUNITY): Payer: Medicare Other

## 2011-09-11 LAB — COMPREHENSIVE METABOLIC PANEL
BUN: 16 mg/dL (ref 6–23)
Calcium: 9.8 mg/dL (ref 8.4–10.5)
Creatinine, Ser: 0.86 mg/dL (ref 0.50–1.35)
GFR calc Af Amer: >60 mL/min (ref >60)
Glucose, Bld: 105 mg/dL — ABNORMAL HIGH (ref 70–99)
Sodium: 138 mEq/L (ref 135–145)
Total Protein: 7.5 g/dL (ref 6.0–8.3)

## 2011-09-11 LAB — SURGICAL PCR SCREEN: Staphylococcus aureus: NEGATIVE

## 2011-09-11 LAB — URINALYSIS, ROUTINE W REFLEX MICROSCOPIC
Leukocytes, UA: NEGATIVE
Nitrite: NEGATIVE
Specific Gravity, Urine: 1.014 (ref 1.005–1.030)
pH: 5.5 (ref 5.0–8.0)

## 2011-09-11 LAB — CBC
MCH: 29.9 pg (ref 26.0–34.0)
MCHC: 34.5 g/dL (ref 30.0–36.0)
Platelets: 192 10*3/uL (ref 150–400)
RBC: 4.92 MIL/uL (ref 4.22–5.81)

## 2011-09-11 LAB — PROTIME-INR: Prothrombin Time: 13.9 seconds (ref 11.6–15.2)

## 2011-09-17 ENCOUNTER — Inpatient Hospital Stay (HOSPITAL_COMMUNITY)
Admission: RE | Admit: 2011-09-17 | Discharge: 2011-09-20 | DRG: 470 | Disposition: A | Discharge status: Home Health | Payer: Medicare Other | Source: Ambulatory Visit | Attending: Orthopedic Surgery | Admitting: Orthopedic Surgery

## 2011-09-17 DIAGNOSIS — Z01812 Encounter for preprocedural laboratory examination: Secondary | ICD-10-CM

## 2011-09-17 DIAGNOSIS — E119 Type 2 diabetes mellitus without complications: Secondary | ICD-10-CM | POA: Diagnosis present

## 2011-09-17 DIAGNOSIS — Z7982 Long term (current) use of aspirin: Secondary | ICD-10-CM

## 2011-09-17 DIAGNOSIS — Z79899 Other long term (current) drug therapy: Secondary | ICD-10-CM

## 2011-09-17 DIAGNOSIS — Z96659 Presence of unspecified artificial knee joint: Secondary | ICD-10-CM

## 2011-09-17 DIAGNOSIS — I10 Essential (primary) hypertension: Secondary | ICD-10-CM | POA: Diagnosis present

## 2011-09-17 DIAGNOSIS — E7800 Pure hypercholesterolemia, unspecified: Secondary | ICD-10-CM | POA: Diagnosis present

## 2011-09-17 DIAGNOSIS — D62 Acute posthemorrhagic anemia: Secondary | ICD-10-CM | POA: Diagnosis not present

## 2011-09-17 DIAGNOSIS — M1710 Unilateral primary osteoarthritis, unspecified knee: Principal | ICD-10-CM | POA: Diagnosis present

## 2011-09-17 DIAGNOSIS — E871 Hypo-osmolality and hyponatremia: Secondary | ICD-10-CM | POA: Diagnosis not present

## 2011-09-17 HISTORY — PX: JOINT REPLACEMENT: SHX530

## 2011-09-17 LAB — GLUCOSE, CAPILLARY
Glucose-Capillary: 121 mg/dL — ABNORMAL HIGH (ref 70–99)
Glucose-Capillary: 146 mg/dL — ABNORMAL HIGH (ref 70–99)

## 2011-09-18 LAB — CBC
HCT: 31.9 % — ABNORMAL LOW (ref 39.0–52.0)
MCV: 87.6 fL (ref 78.0–100.0)
Platelets: 156 10*3/uL (ref 150–400)
RBC: 3.64 MIL/uL — ABNORMAL LOW (ref 4.22–5.81)
WBC: 7 10*3/uL (ref 4.0–10.5)

## 2011-09-18 LAB — BASIC METABOLIC PANEL
CO2: 28 mEq/L (ref 19–32)
Chloride: 100 mEq/L (ref 96–112)
Creatinine, Ser: 0.9 mg/dL (ref 0.50–1.35)
Potassium: 4.6 mEq/L (ref 3.5–5.1)

## 2011-09-19 LAB — CBC
HCT: 26.8 % — ABNORMAL LOW (ref 39.0–52.0)
Hemoglobin: 9.7 g/dL — ABNORMAL LOW (ref 13.0–17.0)
MCH: 31 pg (ref 26.0–34.0)
MCV: 85.6 fL (ref 78.0–100.0)
Platelets: 125 10*3/uL — ABNORMAL LOW (ref 150–400)
RBC: 3.13 MIL/uL — ABNORMAL LOW (ref 4.22–5.81)
WBC: 7 10*3/uL (ref 4.0–10.5)

## 2011-09-19 LAB — BASIC METABOLIC PANEL
CO2: 29 mEq/L (ref 19–32)
Calcium: 8.6 mg/dL (ref 8.4–10.5)
Chloride: 101 mEq/L (ref 96–112)
Glucose, Bld: 143 mg/dL — ABNORMAL HIGH (ref 70–99)
Sodium: 135 mEq/L (ref 135–145)

## 2011-09-20 LAB — CBC
HCT: 24.4 % — ABNORMAL LOW (ref 39.0–52.0)
Hemoglobin: 8.7 g/dL — ABNORMAL LOW (ref 13.0–17.0)
MCH: 30.7 pg (ref 26.0–34.0)
MCHC: 35.7 g/dL (ref 30.0–36.0)
MCV: 86.2 fL (ref 78.0–100.0)
RDW: 12.4 % (ref 11.5–15.5)

## 2011-09-26 NOTE — Discharge Summary (Signed)
NAME:  [name], [name] NO.:  [id number]  MEDICAL RECORD NO.:  [id number]  LOCATION:  1619                         FACILITY:  [facility]  PHYSICIAN:  Ollen Gross, M.D.    DATE OF BIRTH:  28-May-1946  DATE OF ADMISSION:  09/17/2011 DATE OF DISCHARGE:  09/20/2011                              DISCHARGE SUMMARY   ADMITTING DIAGNOSES: 1. Osteoarthritis, left knee. 2. Hypertension. 3. Hypercholesterolemia. 4. Diabetes.  DISCHARGE DIAGNOSES: 1. Osteoarthritis, left knee; status post left total knee replacement     arthroplasty. 2. Postoperative acute blood loss anemia, did not require transfusion. 3. Postoperative hyponatremia, improved. 4. Hypertension. 5. Hypercholesterolemia. 6. Diabetes.  PROCEDURE:  September 17, 2011, left total knee.  SURGEON:  Ollen Gross, M.D.  ASSISTANT:  Alexzandrew L. Perkins, P.A.C.  ANESTHESIA:  General.  TOURNIQUET TIME:  39 minutes.  CONSULTS:  None.  BRIEF HISTORY:  [name] is a 65 year old male with advanced arthritis of the left knee, progressive worsening pain dysfunction, and extensive nonoperative management including analgesics and injections as well as therapy, but this was failed and now presents for total knee arthroplasty.  LABORATORY DATA:  Preop CBC showed hemoglobin of 14.7, hematocrit 42.6, white cell counts 6, and platelets 192.  PT/INR 13.9 and 1.05 with PTT of 30.  Chem panel on admission all within normal limits.  Preop UA is negative.  Blood group type A+.  Nasal swabs were negative for staph aureus and negative for MRSA.  Serial CBCs were followed:  Hemoglobin dropped down to 11.2 then 9.7 on postop day #2 and by day three when he was discharged home, hemoglobin was 8.7 with hematocrit of 24.4.  He did have a drop in his platelets from 192-125, but was already back up to 131 prior to discharge.  BMETs were followed for 48 hours.  Sodium dropped from 138-134 back up to 135.  Glucose went up  105-147, last noted 143 serum level.  X-rays:  Two-view chest dated February 08, 2011, no evidence of acute cardiopulmonary disease.  EKG dated February 08, 2011, marked sinus bradycardia, incomplete right bundle-branch block confirmed by Dr. Marca Ancona.  HOSPITAL COURSE:  The patient was admitted to [facility], taken to OR, underwent above-stated procedure without complication.  The patient tolerated the procedure well, later transferred to recovery room and orthopedic floor, started on p.o. and IV analgesic pain control following surgery given 24 hours postop IV antibiotics, and started on Xarelto for DVT prophylaxis.  He was actually doing pretty well on the morning of day 1 and had decent urinary output.  Sodium was down, felt be a dilutional component but he was diuresing fluids well and we are going to allowing to construct back up on his own.  His meds were restarted for home.  Started to get up out of bed, did fairly well walking over 200 feet by postop day #1 and 2.  On day 2, we changed the dressing and incision looked good.  Hemoglobin was done and 9.7 by then, he was placed on a little low-dose iron, was asymptomatic with this; continued progress well and by day three, he was doing well.  Hemoglobin was 8.7, asymptomatic with  it.  Tolerating his meds and discharged home.  DISCHARGE PLAN: 1. The patient was discharged home on September 20, 2011. 2. Discharge diagnoses:  Please see above. 3. Discharge meds:  Oxycodone, Robaxin, Xarelto, and Nu-Iron.     Continue home meds of Lipitor and lisinopril.  DIET:  Heart-healthy diabetic diet.  ACTIVITY:  Weightbearing as tolerated total protocol.  FOLLOWUP:  2 weeks.  DISPOSITION:  Home.  CONDITION ON DISCHARGE:  Improved.     Alexzandrew L. Julien Girt, P.A.C.   ______________________________ Ollen Gross, M.D.    ALP/MEDQ  D:  09/20/2011  T:  09/20/2011  Job:  [id number]  cc:   Gloriajean Dell. Andrey Campanile, M.D. Fax:  [phone]  Electronically Signed by Patrica Duel P.A.C. on 09/21/2011 09:22:38 AM Electronically Signed by Ollen Gross M.D. on 09/26/2011 11:20:21 AM

## 2011-09-26 NOTE — H&P (Signed)
NAME:  [name], [name] NO.:  [id number]  MEDICAL RECORD NO.:  [id number]  LOCATION:  1619                         FACILITY:  [facility]  PHYSICIAN:  Ollen Gross, M.D.    DATE OF BIRTH:  [birth year]  DATE OF ADMISSION:  09/17/2011                              HISTORY & PHYSICAL   CHIEF COMPLAINT:  Left knee pain.  HISTORY OF PRESENT ILLNESS:  Patient is a 65 year old male who is being seen by Dr. Lequita Halt for ongoing left knee pain.  He is previously well- known to Dr. Lequita Halt and previously undergone revision of previous knee and also manipulation.  The right knee is stable at this point. Actually, he is doing very well and is pleased with his result.  The left knee unfortunately continues to be problematic.  He has known end- stage arthritis with bone-on-bone.  He has had a point now since the right knee is doing much better that he would like to proceed and have the left knee done.  ALLERGIES:  No known drug allergies.  CURRENT MEDICATIONS:  Lisinopril, atorvastatin, aspirin, and a multivitamin.  PAST MEDICAL HISTORY: 1. Hypertension. 2. Hypercholesterolemia. 3. Diabetes mellitus.  PAST SURGICAL HISTORY: 1. Right total knee replacement, 2007. 2. Revision of right total knee replacement in February 2012.  FAMILY HISTORY:  Father deceased at 33.  Mother deceased at 57.  SOCIAL HISTORY:  Married, retired, nonsmoker.  No alcohol.  He does have a caregiver lined up.  He has 3 steps entering his home.  He does have a living will healthcare power of attorney.  REVIEW OF SYSTEMS:  GENERAL:  No fevers, chills, night sweats.  NEURO: No seizures, syncope, or paralysis.  RESPIRATORY:  No shortness of breath, productive cough, or hemoptysis.  CARDIOVASCULAR:  No chest pain or orthopnea.  GI:  No nausea, vomiting, diarrhea, or constipation.  GU: No dysuria, hematuria, or discharge.  MUSCULOSKELETAL:  Left knee.  PHYSICAL EXAMINATION:  VITAL SIGNS:  Pulse is  around 48 to 52, respirations 12, blood pressure 142/78. GENERAL:  65 year old white male, well-nourished, well-developed, tall frame, no acute distress, good historian. HEENT:  Normocephalic, atraumatic.  Pupils are round and reactive.  EOMs intact.  Does wear glasses. NECK:  Supple.  No bruits.  He does have a small lipoma on the right side of his neck. CHEST:  Clear, anterior posterior chest walls. HEART:  Otherwise bradycardic rhythm, pulse is around 50.  No murmurs. S1, S2 noted. ABDOMEN:  Soft, nontender.  Bowel sounds present. RECTAL, BREASTS, GENITALIA:  Not done, not pertinent to present illness. EXTREMITIES:  Left knee, no effusion, slight varus, malalignment deformity, marked crepitus.  Range of motion 5 to 120.  IMPRESSION:  Osteoarthritis, left knee.  PLAN:  Patient will be admitted to [facility] to undergo a left total knee replacement arthroplasty.  Surgery will be performed by Dr. Ollen Gross.     Alexzandrew L. Julien Girt, P.A.C.   ______________________________ Ollen Gross, M.D.  ALP/MEDQ  D:  09/19/2011  T:  09/19/2011  Job:  [id number]  cc:   Gloriajean Dell. Andrey Campanile, M.D. Fax: [phone]  Electronically Signed by Patrica Duel P.A.C. on 09/20/2011 10:58:31 AM Electronically Signed by Ollen Gross  M.D. on 09/26/2011 11:19:49 AM

## 2011-09-26 NOTE — Op Note (Signed)
NAME:  CHRISTIAN, BORGERDING NO.:  [id number]  MEDICAL RECORD NO.:  [id number]  LOCATION:  1619                         FACILITY:  [facility]  PHYSICIAN:  Ollen Gross, M.D.    DATE OF BIRTH:  12-13-1946  DATE OF PROCEDURE:  09/17/2011 DATE OF DISCHARGE:                              OPERATIVE REPORT   PREOPERATIVE DIAGNOSIS:  Osteoarthritis, left knee.  POSTOPERATIVE DIAGNOSIS:  Osteoarthritis, left knee.  PROCEDURE:  Left total knee arthroplasty.  SURGEON:  Ollen Gross, M.D.  ASSISTANT:  Alexzandrew L. Perkins, P.A.C.  ANESTHESIA:  General.  ESTIMATED BLOOD LOSS:  Minimal.  DRAIN:  Hemovac x1.  TOURNIQUET TIME:  39 minutes at 300 mmHg.  COMPLICATIONS:  None.  CONDITION:  Stable to recovery.  BRIEF CLINICAL NOTE:  [name] is a 65 year old male with advanced end-stage arthritis of the left knee with progressively worsening pain and dysfunction.  He has had extensive nonoperative management including injections and analgesics as well as therapy and has failed.  He presents now for a left total knee arthroplasty.  PROCEDURE IN DETAIL:  After successful administration of general anesthetic, a tourniquet was placed on his left thigh and his left lower extremity was prepped and draped in the usual sterile fashion. Extremities wrapped in Esmarch, knee flexed, tourniquet inflated to 300 mmHg.  Midline incision was made with a 10 blade through subcutaneous tissue to the level of the extensor mechanism.  A fresh blade was used to make a medial parapatellar arthrotomy.  Soft tissue over the proximal medial tibia was subperiosteally elevated to the joint line with a knife and into the semimembranosus bursa with a Cobb elevator.  Soft tissue laterally was elevated with attention being paid to avoid the patellar tendon on the tibial tubercle.  The patella was everted, knee flexed 90 degrees, an ACL and PCL removed.  He had severe erosive bone-on-bone change in  the medial and patellofemoral compartments with grooving of his tibia.  A drill was used to create a starting hole in the distal femur and the canal was thoroughly irrigated.  The 5 degree left valgus alignment guide was placed and a distal femoral cutting block pinned to remove 11 mm off the distal femur.  Resection was made with an oscillating saw.  Tibia subluxed forward and the menisci removed.  Extramedullary tibial alignment guide was placed referencing proximally at the medial aspect of the tibial tubercle and distally along the second metatarsal axis and tibial crest.  The block was pinned to remove 2 mm off the more deficient medial side.  Tibial resection was made with an oscillating saw.  Size 4 was the most appropriate tibial component and the proximal tibia is prepared with a modular drill and keel punch for the size 4.  Femoral sizing guide was placed and size 5 was most appropriate. Rotations marked off the epicondylar axis and confirmed by creating a rectangular flexion gap at 90 degrees.  The size 5 cutting block was placed in this rotation and the anterior-posterior chamfer cuts made. Intercondylar block was placed and that cut was made.  Trial size 5 posterior stabilized femur was placed.  A 12.5 mm posterior stabilized rotating platform insert trial  was placed.  Full extensions achieved with excellent varus-valgus and anteroposterior balance throughout the full range of motion.  Patella was everted, thickness measured to be 27 mm.  Freehand resection was taken to 15 mm, 41 template was placed, lug holes were drilled, trial patella was placed, and it tracks normally. Osteophytes removed off the posterior femur with the trial in place. All trials were removed and the cut bone surfaces are prepared with pulsatile lavage.  Cement was mixed and once ready for implantation, the size 4 mobile bearing tibial tray, size 5 posterior stabilized femur, and 41 patella are  cemented into place, patella was held with a clamp. Trial 12.5 mm inserts placed, knee held in full extension, and all extruded cement removed.  When the cement was fully hardened, then the permanent 12.5 mm posterior stabilized rotating platform insert was placed into the tibial tray.  The wound was copiously irrigated with saline solution and then the arthrotomy closed over Hemovac drain with interrupted #1 PDS.  Flexion against gravity was 140 degrees and patella tracks normally.  Tourniquet then released total time of 39 minutes. Subcu was closed with interrupted 2-0 Vicryl and subcuticular running 4- 0 Monocryl.  Catheter for Marcaine pain pump was placed and pumps initiated.  Incision was cleaned and dried and Steri-Strips and a bulky sterile dressing were applied.  He was then placed into a knee immobilizer, awakened, and transported to recovery in stable condition.  Please note that the surgical assistant was a medical necessity for this case in order to perform it in a safe and expeditious manner.  Surgical assistance necessary for retraction of ligaments and vital neurovascular structures to prevent them from injury.  Surgical assistance also necessary for proper positioning of the limb to allow for anatomic placement of the prosthetic components.     Ollen Gross, M.D.     FA/MEDQ  D:  09/17/2011  T:  09/17/2011  Job:  [id number]  Electronically Signed by Ollen Gross M.D. on 09/26/2011 11:19:45 AM

## 2011-11-27 ENCOUNTER — Other Ambulatory Visit: Payer: Self-pay | Admitting: Orthopedic Surgery

## 2011-11-28 ENCOUNTER — Encounter (HOSPITAL_COMMUNITY): Payer: Self-pay | Admitting: Pharmacy Technician

## 2011-11-29 ENCOUNTER — Encounter (HOSPITAL_COMMUNITY): Payer: Self-pay | Admitting: *Deleted

## 2011-12-04 ENCOUNTER — Encounter (HOSPITAL_COMMUNITY): Payer: Self-pay | Admitting: Orthopedic Surgery

## 2011-12-04 NOTE — H&P (Signed)
  CC- [name] is a 65 y.o. male who presents with left knee stiffness.Marland Kitchen  HPI- . Knee Pain: Patient presents with stiffness involving the  left knee. Onset of the symptoms was about a month ago. Inciting event: none known. Current symptoms include stiffness. Pain is aggravated by inactivity.  Patient has had prior knee problems. Evaluation to date: PT evaluation Range of motion 5 to 90 degrees.. Treatment to date: PT which was somewhat effective.  Past Medical History  Diagnosis Date  . Arthritis   . Hypertension   . Diabetes mellitus     exercise and diet controlled    Past Surgical History  Procedure Date  . Joint replacement 09/2011    left total knee  . Joint replacement 06/2006    right total knee    Prior to Admission medications   Medication Sig Start Date End Date Taking? Authorizing Provider  aspirin EC 81 MG tablet Take 81 mg by mouth at bedtime.     Historical Provider, MD  atorvastatin (LIPITOR) 20 MG tablet Take 20 mg by mouth at bedtime.     Historical Provider, MD  Cholecalciferol (VITAMIN D) 2000 UNITS tablet Take 2,000 Units by mouth daily.     Historical Provider, MD  fish oil-omega-3 fatty acids 1000 MG capsule Take 1 g by mouth daily.     Historical Provider, MD  lisinopril (PRINIVIL,ZESTRIL) 20 MG tablet Take 20 mg by mouth at bedtime.     Historical Provider, MD  Multiple Vitamins-Minerals (MULTIVITAMINS THER. W/MINERALS) TABS Take 1 tablet by mouth daily.     Historical Provider, MD  oxycodone (OXY-IR) 5 MG capsule Take 5 mg by mouth every 4 (four) hours as needed. PAIN     Historical Provider, MD  vitamin E 400 UNIT capsule Take 400 Units by mouth daily.     Historical Provider, MD   Had TKA left knee 3 months ago and has had  Stiffness and inability to progress ROM with PT. Current range 5 to 90 with no progression in past 6 weeks. Presents for closed manipulation  Physical Examination: General appearance - alert, well appearing, and in no  distress Mental status - alert, oriented to person, place, and time Chest - clear to auscultation, no wheezes, rales or rhonchi, symmetric air entry Heart - normal rate, regular rhythm, normal S1, S2, no murmurs, rubs, clicks or gallops Abdomen - soft, nontender, nondistended, no masses or organomegaly Neurological - alert, oriented, normal speech, no focal findings or movement disorder noted   Asessment/Plan--- Left knee arthrofibrosis- Plan closed manipulation.. Procedure risks and potential comps discussed with patient who elects to proceed. Goals are decreased pain and increased function with a high likelihood of achieving both

## 2011-12-05 ENCOUNTER — Ambulatory Visit (HOSPITAL_COMMUNITY): Payer: Medicare Other | Admitting: Anesthesiology

## 2011-12-05 ENCOUNTER — Encounter (HOSPITAL_COMMUNITY): Payer: Self-pay | Admitting: Anesthesiology

## 2011-12-05 ENCOUNTER — Ambulatory Visit (HOSPITAL_COMMUNITY)
Admission: RE | Admit: 2011-12-05 | Discharge: 2011-12-05 | Disposition: A | Discharge status: Home / Self Care | Payer: Medicare Other | Source: Ambulatory Visit | Attending: Orthopedic Surgery | Admitting: Orthopedic Surgery

## 2011-12-05 ENCOUNTER — Encounter (HOSPITAL_COMMUNITY)
Admission: RE | Disposition: A | Discharge status: Home / Self Care | Payer: Self-pay | Source: Ambulatory Visit | Attending: Orthopedic Surgery

## 2011-12-05 ENCOUNTER — Encounter (HOSPITAL_COMMUNITY): Payer: Self-pay | Admitting: *Deleted

## 2011-12-05 ENCOUNTER — Encounter (HOSPITAL_COMMUNITY): Payer: Self-pay | Admitting: Orthopedic Surgery

## 2011-12-05 DIAGNOSIS — E119 Type 2 diabetes mellitus without complications: Secondary | ICD-10-CM | POA: Insufficient documentation

## 2011-12-05 DIAGNOSIS — Z96659 Presence of unspecified artificial knee joint: Secondary | ICD-10-CM | POA: Insufficient documentation

## 2011-12-05 DIAGNOSIS — I10 Essential (primary) hypertension: Secondary | ICD-10-CM | POA: Insufficient documentation

## 2011-12-05 DIAGNOSIS — M25569 Pain in unspecified knee: Secondary | ICD-10-CM

## 2011-12-05 DIAGNOSIS — Z7982 Long term (current) use of aspirin: Secondary | ICD-10-CM | POA: Insufficient documentation

## 2011-12-05 DIAGNOSIS — Z79899 Other long term (current) drug therapy: Secondary | ICD-10-CM | POA: Insufficient documentation

## 2011-12-05 DIAGNOSIS — M24669 Ankylosis, unspecified knee: Secondary | ICD-10-CM | POA: Insufficient documentation

## 2011-12-05 HISTORY — DX: Unspecified osteoarthritis, unspecified site: M19.90

## 2011-12-05 HISTORY — PX: KNEE CLOSED REDUCTION: SHX995

## 2011-12-05 HISTORY — DX: Essential (primary) hypertension: I10

## 2011-12-05 LAB — BASIC METABOLIC PANEL
Chloride: 102 mEq/L (ref 96–112)
GFR calc Af Amer: >90 mL/min (ref >90)
GFR calc non Af Amer: 87 mL/min — ABNORMAL LOW (ref >90)
Glucose, Bld: 102 mg/dL — ABNORMAL HIGH (ref 70–99)
Potassium: 4.2 mEq/L (ref 3.5–5.1)
Sodium: 138 mEq/L (ref 135–145)

## 2011-12-05 LAB — CBC
Hemoglobin: 13.3 g/dL (ref 13.0–17.0)
MCHC: 34.3 g/dL (ref 30.0–36.0)
WBC: 5.1 10*3/uL (ref 4.0–10.5)

## 2011-12-05 LAB — GLUCOSE, CAPILLARY: Glucose-Capillary: 85 mg/dL (ref 70–99)

## 2011-12-05 LAB — SURGICAL PCR SCREEN: Staphylococcus aureus: NEGATIVE

## 2011-12-05 SURGERY — MANIPULATION, KNEE, CLOSED
Anesthesia: General | Site: Knee | Laterality: Left | Wound class: Clean

## 2011-12-05 MED ORDER — OXYCODONE HCL 5 MG PO TABS: 5.0000 mg | ORAL_TABLET | ORAL | Status: AC | PRN

## 2011-12-05 MED ORDER — LIDOCAINE HCL (CARDIAC) 20 MG/ML IV SOLN
INTRAVENOUS | Status: DC | PRN
  Administered 2011-12-05: 50 mg via INTRAVENOUS

## 2011-12-05 MED ORDER — METHOCARBAMOL 500 MG PO TABS: 500.0000 mg | ORAL_TABLET | Freq: Four times a day (QID) | ORAL | Status: AC

## 2011-12-05 MED ORDER — FENTANYL CITRATE 0.05 MG/ML IJ SOLN
INTRAMUSCULAR | Status: DC | PRN
  Administered 2011-12-05: 50 ug via INTRAVENOUS

## 2011-12-05 MED ORDER — FENTANYL CITRATE 0.05 MG/ML IJ SOLN: 25.0000 ug | INTRAMUSCULAR | Status: DC | PRN

## 2011-12-05 MED ORDER — PROMETHAZINE HCL 25 MG/ML IJ SOLN: 6.2500 mg | INTRAMUSCULAR | Status: DC | PRN

## 2011-12-05 MED ORDER — SODIUM CHLORIDE 0.9 % IV SOLN: INTRAVENOUS | Status: DC

## 2011-12-05 MED ORDER — LACTATED RINGERS IV SOLN
INTRAVENOUS | Status: DC
  Administered 2011-12-05: 1000 mL via INTRAVENOUS

## 2011-12-05 MED ORDER — PROPOFOL 10 MG/ML IV BOLUS
INTRAVENOUS | Status: DC | PRN
  Administered 2011-12-05: 200 mg via INTRAVENOUS

## 2011-12-05 MED ORDER — ACETAMINOPHEN 10 MG/ML IV SOLN: 1000.0000 mg | Freq: Once | INTRAVENOUS | Status: DC

## 2011-12-05 MED ORDER — MUPIROCIN 2 % EX OINT
TOPICAL_OINTMENT | CUTANEOUS | Status: AC
  Filled 2011-12-05: qty 22

## 2011-12-05 SURGICAL SUPPLY — 12 items
BANDAGE ADHESIVE 1X3 (GAUZE/BANDAGES/DRESSINGS) IMPLANT
CLOTH BEACON ORANGE TIMEOUT ST (SAFETY) ×2 IMPLANT
GLOVE BIO SURGEON STRL SZ7.5 (GLOVE) ×1 IMPLANT
GOWN STRL NON-REIN LRG LVL3 (GOWN DISPOSABLE) ×1 IMPLANT
GOWN STRL REIN XL XLG (GOWN DISPOSABLE) ×1 IMPLANT
MANIFOLD NEPTUNE II (INSTRUMENTS) ×1 IMPLANT
NDL SAFETY ECLIPSE 18X1.5 (NEEDLE) IMPLANT
NEEDLE HYPO 18GX1.5 SHARP (NEEDLE)
POSITIONER SURGICAL ARM (MISCELLANEOUS) ×1 IMPLANT
SPONGE GAUZE 4X4 12PLY (GAUZE/BANDAGES/DRESSINGS) IMPLANT
SYR CONTROL 10ML LL (SYRINGE) IMPLANT
TOWEL OR 17X26 10 PK STRL BLUE (TOWEL DISPOSABLE) ×2 IMPLANT

## 2011-12-05 NOTE — Anesthesia Postprocedure Evaluation (Signed)
  Anesthesia Post-op Note  Patient: [name]  Procedure(s) Performed:  CLOSED MANIPULATION KNEE  Patient Location: PACU  Anesthesia Type: General  Level of Consciousness: awake and alert   Airway and Oxygen Therapy: Patient Spontanous Breathing  Post-op Pain: mild  Post-op Assessment: Post-op Vital signs reviewed, Patient's Cardiovascular Status Stable, Respiratory Function Stable, Patent Airway and No signs of Nausea or vomiting  Post-op Vital Signs: stable  Complications: No apparent anesthesia complications

## 2011-12-05 NOTE — Anesthesia Preprocedure Evaluation (Addendum)
Anesthesia Evaluation  Patient identified by MRN, date of birth, ID band Patient awake    Reviewed: Allergy & Precautions, H&P , NPO status , Patient's Chart, lab work & pertinent test results  Airway Mallampati: II TM Distance: >3 FB Neck ROM: Full    Dental No notable dental hx.    Pulmonary neg pulmonary ROS,  clear to auscultation  Pulmonary exam normal       Cardiovascular hypertension, Pt. on medications neg cardio ROS Regular Normal    Neuro/Psych Negative Neurological ROS  Negative Psych ROS   GI/Hepatic negative GI ROS, Neg liver ROS,   Endo/Other  Negative Endocrine ROSDiabetes mellitus-, Type 2Diet and exercise controlled diabetes  Renal/GU negative Renal ROS  Genitourinary negative   Musculoskeletal negative musculoskeletal ROS (+)   Abdominal   Peds negative pediatric ROS (+)  Hematology negative hematology ROS (+)   Anesthesia Other Findings   Reproductive/Obstetrics negative OB ROS                           Anesthesia Physical Anesthesia Plan  ASA: II  Anesthesia Plan: General   Post-op Pain Management:    Induction: Intravenous  Airway Management Planned: Mask  Additional Equipment:   Intra-op Plan:   Post-operative Plan:   Informed Consent: I have reviewed the patients History and Physical, chart, labs and discussed the procedure including the risks, benefits and alternatives for the proposed anesthesia with the patient or authorized representative who has indicated his/her understanding and acceptance.   Dental advisory given  Plan Discussed with: CRNA  Anesthesia Plan Comments:        Anesthesia Quick Evaluation

## 2011-12-05 NOTE — Interval H&P Note (Signed)
History and Physical Interval Note:  12/05/2011 4:45 PM  [name]  has presented today for surgery, with the diagnosis of Arthrofibrosis of the Left Knee  The various methods of treatment have been discussed with the patient and family. After consideration of risks, benefits and other options for treatment, the patient has consented to  Procedure(s): CLOSED MANIPULATION KNEE as a surgical intervention .  The patients' history has been reviewed, patient examined, no change in status, stable for surgery.  I have reviewed the patients' chart and labs.  Questions were answered to the patient's satisfaction.     Loanne Drilling

## 2011-12-05 NOTE — Brief Op Note (Signed)
12/05/2011  5:16 PM  PATIENT:  [name]  65 y.o. male  PRE-OPERATIVE DIAGNOSIS:  Arthrofibrosis of the Left Knee  POST-OPERATIVE DIAGNOSIS:  arthrofibrosis left knee  PROCEDURE:  Procedure(s): CLOSED MANIPULATION KNEE- left  SURGEON:  Surgeon(s): Timothy Rankin Sady Monaco  ASSISTANTS: none   ANESTHESIA:   general  DICTATION: .Other Dictation: Dictation Number [id number]  PLAN OF CARE: Discharge to home after PACU  PATIENT DISPOSITION:  PACU - hemodynamically stable.   Timothy Rankin [name], MD    12/05/2011, 5:17 PM

## 2011-12-05 NOTE — Transfer of Care (Signed)
Immediate Anesthesia Transfer of Care Note  Patient: [name]  Procedure(s) Performed:  CLOSED MANIPULATION KNEE  Patient Location: PACU  Anesthesia Type: General  Level of Consciousness: sedated  Airway & Oxygen Therapy: Patient Spontanous Breathing and Patient connected to face mask oxygen  Post-op Assessment: Report given to PACU RN and Post -op Vital signs reviewed and stable  Post vital signs: Reviewed and stable  Complications: No apparent anesthesia complications

## 2011-12-06 NOTE — Op Note (Signed)
NAME:  Timothy, Collier NO.:  [id number]  MEDICAL RECORD NO.:  [id number]  LOCATION:  [facility]                         FACILITY:  [facility]  PHYSICIAN:  Ollen Gross, M.D.    DATE OF BIRTH:  01-12-1946  DATE OF PROCEDURE:  12/05/2011 DATE OF DISCHARGE:  12/05/2011                              OPERATIVE REPORT   PREOPERATIVE DIAGNOSIS:  Arthrofibrosis, left knee.  POSTOPERATIVE DIAGNOSIS:  Arthrofibrosis, left knee.  PROCEDURE:  Left knee closed manipulation.  SURGEON:  Ollen Gross, MD  ASSISTANT:  None.  ANESTHESIA:  General.  Pre-manipulation range of motion is 5-90.  Post-manipulation range of motion 0-125.  COMPLICATIONS:  None.  CONDITION:  Stable to Recovery.  BRIEF CLINICAL NOTE:  [name] is a 65 year old male who underwent a left total knee arthroplasty approximately 3 months ago.  He has been slow to regain motion, has been stuck at about 90 degrees of flexion for the past 6 weeks.  He had to have a close manipulation after his right total knee and presents now for closed manipulation of his left knee.  PROCEDURE IN DETAIL:  After successful administration of general anesthetic, exam under anesthesia was performed, and his range of motion is 5-90.  I then placed my chest up against his proximal tibia and flexed the knee with audible lysis of adhesions.  I was able to get him flexed to 125 without difficulty.  Full extension was also achieved.  I manipulated his patella also to mobilize it better.  He was subsequently awakened and transported to Recovery in stable condition.     Ollen Gross, M.D.    FA/MEDQ  D:  12/05/2011  T:  12/06/2011  Job:  [id number]

## 2011-12-14 ENCOUNTER — Encounter (HOSPITAL_COMMUNITY): Payer: Self-pay | Admitting: Orthopedic Surgery

## 2015-02-17 DIAGNOSIS — Z950 Presence of cardiac pacemaker: Secondary | ICD-10-CM | POA: Insufficient documentation

## 2015-02-17 HISTORY — PX: PACEMAKER INSERTION: SHX728

## 2015-03-17 ENCOUNTER — Encounter: Payer: Self-pay | Admitting: *Deleted

## 2015-03-23 ENCOUNTER — Ambulatory Visit (INDEPENDENT_AMBULATORY_CARE_PROVIDER_SITE_OTHER): Payer: Medicare PPO | Admitting: Internal Medicine

## 2015-03-23 ENCOUNTER — Ambulatory Visit (INDEPENDENT_AMBULATORY_CARE_PROVIDER_SITE_OTHER): Payer: Medicare PPO | Admitting: *Deleted

## 2015-03-23 ENCOUNTER — Encounter: Payer: Self-pay | Admitting: Internal Medicine

## 2015-03-23 VITALS — BP 124/76 | HR 79 | Ht 73.0 in | Wt 209.6 lb

## 2015-03-23 DIAGNOSIS — I495 Sick sinus syndrome: Secondary | ICD-10-CM

## 2015-03-23 DIAGNOSIS — I480 Paroxysmal atrial fibrillation: Secondary | ICD-10-CM

## 2015-03-23 DIAGNOSIS — I4891 Unspecified atrial fibrillation: Secondary | ICD-10-CM

## 2015-03-23 LAB — MDC_IDC_ENUM_SESS_TYPE_INCLINIC
Brady Statistic AP VS Percent: 94 %
Brady Statistic AS VP Percent: 0 %
Brady Statistic AS VS Percent: 5 %
Lead Channel Pacing Threshold Amplitude: 0.5 V
Lead Channel Pacing Threshold Amplitude: 0.5 V
Lead Channel Pacing Threshold Pulse Width: 0.4 ms
Lead Channel Pacing Threshold Pulse Width: 0.4 ms
Lead Channel Sensing Intrinsic Amplitude: 22.4 mV
Lead Channel Setting Pacing Pulse Width: 0.4 ms
MDC IDC MSMT BATTERY IMPEDANCE: 100 Ohm
MDC IDC MSMT BATTERY REMAINING LONGEVITY: 117 mo
MDC IDC MSMT BATTERY VOLTAGE: 2.79 V
MDC IDC MSMT LEADCHNL RA IMPEDANCE VALUE: 592 Ohm
MDC IDC MSMT LEADCHNL RA SENSING INTR AMPL: 2 mV
MDC IDC MSMT LEADCHNL RV IMPEDANCE VALUE: 791 Ohm
MDC IDC SESS DTM: 20160406104845
MDC IDC SET LEADCHNL RA PACING AMPLITUDE: 3.5 V
MDC IDC SET LEADCHNL RV PACING AMPLITUDE: 3.5 V
MDC IDC SET LEADCHNL RV SENSING SENSITIVITY: 5.6 mV
MDC IDC STAT BRADY AP VP PERCENT: 1 %

## 2015-03-23 MED ORDER — METOPROLOL TARTRATE 25 MG PO TABS: ORAL_TABLET | ORAL | Status: DC

## 2015-03-23 NOTE — Progress Notes (Signed)
Electrophysiology Office Note   Date:  03/23/2015   ID:  [name], DOB 1946/02/05, MRN [id number]  PCP:  Benedetto GoadFred Wilson MD, [facility] Cardiologist:  Dr Jacinto HalimGanji Primary Electrophysiologist: Hillis RangeJames Farouk Vivero, MD    Chief Complaint  Patient presents with  . Appointment    Atrial tachycardia     History of Present Illness: [name] is a 69 y.o. male who presents today for electrophysiology evaluation.   He had SSS and [place] PPM [place] in [place].  Since PPM implant, his energy is better.  He is more alert.  His remains inactive due to bilateral knee pain.  He has been observed to have atrial fibrillation on his pacemaker interrogation by Dr Jacinto HalimGanji.  (Current burden is 7%).  He has rare fluttering which "does not last very long".  He has been placed on pradaxa which he is tolerating without difficulty. Today, he denies symptoms of chest pain, shortness of breath, orthopnea, PND, lower extremity edema, claudication, dizziness, presyncope, syncope, bleeding, or neurologic sequela. The patient is tolerating medications without difficulties and is otherwise without complaint today.    Past Medical History  Diagnosis Date  . Arthritis   . Hypertension   . Diabetes mellitus     exercise and diet controlled  . Sick sinus syndrome   . Pacemaker   . Paroxysmal atrial fibrillation    Past Surgical History  Procedure Laterality Date  . Joint replacement  09/2011    left total knee  . Joint replacement  06/2006    right total knee  . Knee closed reduction  12/05/2011    Procedure: CLOSED MANIPULATION KNEE;  Surgeon: Loanne DrillingFrank V Aluisio;  Location: [facility];  Service: Orthopedics;  Laterality: Left;  . Pacemaker insertion  02/17/2015    MDT Adapta implanted by Dr Ambrose PancoastSkipper in [place] for sick sinus syndrome     Current Outpatient Prescriptions  Medication Sig Dispense Refill  . dabigatran (PRADAXA) 150 MG CAPS capsule Take 150 mg by mouth 2 (two) times  daily.    Marland Kitchen. escitalopram (LEXAPRO) 10 MG tablet Take 10 mg by mouth daily.    Marland Kitchen. lisinopril (PRINIVIL,ZESTRIL) 20 MG tablet Take 20 mg by mouth at bedtime.     . rosuvastatin (CRESTOR) 10 MG tablet Take 10 mg by mouth daily.     No current facility-administered medications for this visit.    Allergies:   Atorvastatin   Social History:  The patient  reports that he has never smoked. He does not have any smokeless tobacco history on file. He reports that he does not drink alcohol or use illicit drugs.   Family History:  The patient's family history includes Heart attack (age of onset: 6238) in his father; Heart disease in his mother; Hypertension in his mother.    ROS:  Please see the history of present illness.   All other systems are reviewed and negative.    PHYSICAL EXAM: VS:  BP 124/76 mmHg  Pulse 79  Ht 6\' 1"  (1.854 m)  Wt 209 lb 9.6 oz (95.074 kg)  BMI 27.66 kg/m2 , BMI Body mass index is 27.66 kg/(m^2). GEN: Well nourished, well developed, in no acute distress HEENT: normal Neck: no JVD, carotid bruits, or masses Cardiac: RRR; no murmurs, rubs, or gallops,no edema  Respiratory:  clear to auscultation bilaterally, normal work of breathing GI: soft, nontender, nondistended, + BS MS: no deformity or atrophy Skin: warm and dry, device pocket is well healed Neuro:  Strength  and sensation are intact Psych: euthymic mood, full affect  EKG:  EKG is ordered today. The ekg ordered today shows atrial pacing at 80 bpm, PR 202, QRS 140, RBBB, otherwise normal ekg  Device interrogation is reviewed today in detail.  See PaceArt for details.  Lipid Panel  No results found for: CHOL, TRIG, HDL, CHOLHDL, VLDL, LDLCALC, LDLDIRECT   Wt Readings from Last 3 Encounters:  03/23/15 209 lb 9.6 oz (95.074 kg)      Other studies Reviewed: Additional studies/ records that were reviewed today include: Dr Jerre Simon notes are reviewed Over 50 pages of records from [place] are reviewed which  detail his admit for bradycardia and PPM implant    ASSESSMENT AND PLAN:  1.  Sick sinus syndrome Normal pacemaker function See Pace Art report No changes today Would reduce outputs to chronic settings 3 months post implant to promote battery longevity.  Dr Jacinto Halim will do this.  2.  Atrial fibrillation He has paroxysmal atrial fibrillation chads2vasc score is at least 3.  He has been placed on pradaxa by Dr Jacinto Halim.  WIll need BMET twice per year by Dr Jacinto Halim for maintenance dosing Device interrogation today reveals afib burden to be 7%.  V rates appear to be mostly controlled.  We will prescribed metoprolol  BId which he has been reluctant to take. Given minimal symptoms, I would not advise AAD therapy or ablation.  He will take metoprolol prn for palpitations.  No further EP workup/ management is planned unless he worsens.  Current medicines are reviewed at length with the patient today.   The patient does not have concerns regarding his medicines.  The following changes were made today:  none   Follow-up with Dr Jacinto Halim as scheduled I will see as needed going forward  Signed, Hillis Range, MD  03/23/2015 9:24 AM     [facility] [address] [place] [address] [phone] (office) [phone] (fax)

## 2015-03-23 NOTE — Patient Instructions (Signed)
Your physician has recommended you make the following change in your medication:  1) Start lopressor (metoprolol tartrate) 25 mg one tablet by mouth twice daily as needed for heart fluttering/ palpitations  Dr. Johney FrameAllred will see you back on an as needed basis.

## 2015-03-23 NOTE — Progress Notes (Signed)
Pacemaker check in clinic. Normal device function. Thresholds, sensing, impedances consistent with previous measurements. Device programmed to maximize longevity. 13 mode switches (7.2%)---12 AHR episodes---max dur. 17 hrs 53 mins, Max A >400 + Pradaxa. No high ventricular rates noted. Device programmed at appropriate safety margins. Histogram distribution appropriate for patient activity level. Device programmed to optimize intrinsic conduction. Estimated longevity 9.5 years. Patient will follow up with JA prn and with Ganji as scheduled.

## 2015-03-28 ENCOUNTER — Encounter: Payer: Self-pay | Admitting: Internal Medicine

## 2015-06-13 ENCOUNTER — Other Ambulatory Visit: Payer: Self-pay

## 2018-11-10 ENCOUNTER — Ambulatory Visit: Payer: Medicare Other | Admitting: Internal Medicine

## 2018-11-10 ENCOUNTER — Encounter: Payer: Self-pay | Admitting: Internal Medicine

## 2018-11-10 VITALS — BP 116/84 | HR 69 | Ht 73.0 in | Wt 201.6 lb

## 2018-11-10 DIAGNOSIS — I495 Sick sinus syndrome: Secondary | ICD-10-CM

## 2018-11-10 DIAGNOSIS — I480 Paroxysmal atrial fibrillation: Secondary | ICD-10-CM | POA: Diagnosis not present

## 2018-11-10 LAB — CUP PACEART INCLINIC DEVICE CHECK
Battery Impedance: 454 Ohm
Battery Remaining Longevity: 85 mo
Battery Voltage: 2.78 V
Brady Statistic AP VP Percent: 10 %
Brady Statistic AS VP Percent: 0 %
Date Time Interrogation Session: 20191125160401
Implantable Lead Implant Date: 20160303
Implantable Lead Location: 753860
Implantable Lead Model: 5076
Implantable Lead Model: 5092
Implantable Pulse Generator Implant Date: 20160303
Lead Channel Impedance Value: 549 Ohm
Lead Channel Pacing Threshold Amplitude: 0.5 V
Lead Channel Pacing Threshold Amplitude: 0.625 V
Lead Channel Pacing Threshold Amplitude: 0.75 V
Lead Channel Pacing Threshold Pulse Width: 0.4 ms
Lead Channel Pacing Threshold Pulse Width: 0.4 ms
Lead Channel Setting Pacing Pulse Width: 0.4 ms
MDC IDC LEAD IMPLANT DT: 20160303
MDC IDC LEAD LOCATION: 753859
MDC IDC MSMT LEADCHNL RA IMPEDANCE VALUE: 546 Ohm
MDC IDC MSMT LEADCHNL RV PACING THRESHOLD AMPLITUDE: 0.75 V
MDC IDC MSMT LEADCHNL RV PACING THRESHOLD PULSEWIDTH: 0.4 ms
MDC IDC MSMT LEADCHNL RV PACING THRESHOLD PULSEWIDTH: 0.4 ms
MDC IDC SET LEADCHNL RA PACING AMPLITUDE: 2 V
MDC IDC SET LEADCHNL RV PACING AMPLITUDE: 2.5 V
MDC IDC SET LEADCHNL RV SENSING SENSITIVITY: 2.8 mV
MDC IDC STAT BRADY AP VS PERCENT: 89 %
MDC IDC STAT BRADY AS VS PERCENT: 1 %

## 2018-11-10 NOTE — Progress Notes (Signed)
ELECTROPHYSIOLOGY CONSULT NOTE  Patient ID: [name], MRN: [id number], DOB/AGE: October 22, 1946 72 y.o. Admit date: (Not on file) Date of Consult: 11/10/2018  Primary Physician: Barbie BannerWilson, Fred H, MD Primary Cardiologist: Jerel ShepherdJG     Nazar Vannice is a 72 y.o. male who is being seen today for the evaluation of Atrial flutter  at the request of JG.    HPI [name] is a 72 y.o. male with known atrial fibrillation   He saw Dr. Fawn KirkJA 2016 who reviewed the data and reported "paroxysmal atrial fibrillation with a burden of about 7%.  Anticoagulated with dabigatran  Some palpitations no chest pain no significant shortness of breath or edema  DATE TEST EF                    Thromboembolic risk factors ( age -59 , HTN-1, DM-1 [Hgb A1c 6.4]) for a CHADSVASc Score of 3-4\   Past Medical History:  Diagnosis Date  . Arthritis   . Diabetes mellitus    exercise and diet controlled  . Hypertension   . Pacemaker   . Paroxysmal atrial fibrillation (HCC)   . Sick sinus syndrome [facility](HCC)       Surgical History:  Past Surgical History:  Procedure Laterality Date  . JOINT REPLACEMENT  09/2011   left total knee  . JOINT REPLACEMENT  06/2006   right total knee  . KNEE CLOSED REDUCTION  12/05/2011   Procedure: CLOSED MANIPULATION KNEE;  Surgeon: Loanne DrillingFrank V Aluisio;  Location: [facility];  Service: Orthopedics;  Laterality: Left;  . PACEMAKER INSERTION  02/17/2015   MDT Adapta implanted by Dr Ambrose PancoastSkipper in [place] for sick sinus syndrome     Home Meds: Current Meds  Medication Sig  . citalopram (CELEXA) 20 MG tablet Take 1 tablet by mouth daily.  . Coenzyme Q-10 200 MG CAPS Take 1 capsule by mouth daily.  . dabigatran (PRADAXA) 150 MG CAPS capsule Take 150 mg by mouth 2 (two) times daily.  . flecainide (TAMBOCOR) 50 MG tablet Take 1 tablet by mouth 2 (two) times daily.  Marland Kitchen. L-Lysine HCl 500 MG TABS Take 1 tablet by mouth daily.  Marland Kitchen. lisinopril (PRINIVIL,ZESTRIL) 20 MG tablet Take 20 mg by  mouth at bedtime.   . metoprolol tartrate (LOPRESSOR) 100 MG tablet Take 1 tablet by mouth 2 (two) times daily.  . rosuvastatin (CRESTOR) 10 MG tablet Take 10 mg by mouth daily.    Allergies:  Allergies  Allergen Reactions  . Atorvastatin     myalgias    Social History   Socioeconomic History  . Marital status: Married    Spouse name: Not on file  . Number of children: Not on file  . Years of education: Not on file  . Highest education level: Not on file  Occupational History  . Not on file  Social Needs  . Financial resource strain: Not on file  . Food insecurity:    Worry: Not on file    Inability: Not on file  . Transportation needs:    Medical: Not on file    Non-medical: Not on file  Tobacco Use  . Smoking status: Never Smoker  . Smokeless tobacco: Never Used  Substance and Sexual Activity  . Alcohol use: No    Alcohol/week: 0.0 standard drinks  . Drug use: No  . Sexual activity: Not on file  Lifestyle  . Physical activity:    Days per week: Not on file  Minutes per session: Not on file  . Stress: Not on file  Relationships  . Social connections:    Talks on phone: Not on file    Gets together: Not on file    Attends religious service: Not on file    Active member of club or organization: Not on file    Attends meetings of clubs or organizations: Not on file    Relationship status: Not on file  . Intimate partner violence:    Fear of current or ex partner: Not on file    Emotionally abused: Not on file    Physically abused: Not on file    Forced sexual activity: Not on file  Other Topics Concern  . Not on file  Social History Narrative   Lives in [place].  Spends 3-4 months per year in [place] in an [place].   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     Family History  Problem Relation Age of Onset  . Heart disease Mother        angioplasty x2    . Hypertension Mother   . Heart attack Father 38     ROS:  Please see the history of present illness.     All other systems reviewed and negative.    Physical Exam: Blood pressure 116/84, pulse 69, height 6\' 1"  (1.854 m), weight 201 lb 9.6 oz (91.4 kg), SpO2 97 %. General: Well developed, well nourished male in no acute distress. Head: Normocephalic, atraumatic, sclera non-icteric, no xanthomas, nares are without discharge. EENT: normal  Lymph Nodes:  none Neck: Negative for carotid bruits. JVD not elevated. Back:without scoliosis kyphosis Lungs: Clear bilaterally to auscultation without wheezes, rales, or rhonchi. Breathing is unlabored. Heart: RRR with S1 S2. No  murmur . No rubs, or gallops appreciated. Abdomen: Soft, non-tender, non-distended with normoactive bowel sounds. No hepatomegaly. No rebound/guarding. No obvious abdominal masses. Msk:  Strength and tone appear normal for age. Extremities: No clubbing or cyanosis. No edema.  Distal pedal pulses are 2+ and equal bilaterally. Skin: Warm and Dry Neuro: Alert and oriented X 3. CN III-XII intact Grossly normal sensory and motor function . Psych:  Responds to questions appropriately with a normal affect.      Labs: Cardiac Enzymes No results for input(s): CKTOTAL, CKMB, TROPONINI in the last 72 hours. CBC Lab Results  Component Value Date   WBC 5.1 12/05/2011   HGB 13.3 12/05/2011   HCT 38.8 (L) 12/05/2011   MCV 83.6 12/05/2011   PLT 171 12/05/2011   PROTIME: No results for input(s): LABPROT, INR in the last 72 hours. Chemistry No results for input(s): NA, K, CL, CO2, BUN, CREATININE, CALCIUM, PROT, BILITOT, ALKPHOS, ALT, AST, GLUCOSE in the last 168 hours.  Invalid input(s): LABALBU Lipids No results found for: CHOL, HDL, LDLCALC, TRIG BNP No results found for: PROBNP Thyroid Function Tests: No results for input(s): TSH, T4TOTAL, T3FREE, THYROIDAB in the last 72 hours.  Invalid input(s):  FREET3 Miscellaneous No results found for: DDIMER  Radiology/Studies:  No results found.  EKG: AV pacing   Assessment and Plan:   Atrial fibrillation-paroxysmal   Sinus node dysfunction  Pacemaker [employer]   Complete heart block  Syncope  The patient has recurrent atrial fibrillation.  It is coarse and has an electrogram in appearance of flutter but is irregular with varying morphologies.  Hence, and with his previous diagnosis of atrial fibrillation,  would not undertake an ablation of the flutter substrate.    Currently in the [place] guidelines, ablation does not constitute a reason to stop anticoagulation with atrial fibrillation although it does with atrial flutter.  He is on flecainide.  I will review with Dr. Jacinto Halim, his indication as he denies significant symptoms.    Sherryl Manges

## 2019-02-05 DIAGNOSIS — I4891 Unspecified atrial fibrillation: Secondary | ICD-10-CM

## 2019-02-05 DIAGNOSIS — Z45018 Encounter for adjustment and management of other part of cardiac pacemaker: Secondary | ICD-10-CM | POA: Diagnosis not present

## 2019-02-05 DIAGNOSIS — Z950 Presence of cardiac pacemaker: Secondary | ICD-10-CM

## 2019-03-02 ENCOUNTER — Other Ambulatory Visit: Payer: Self-pay | Admitting: Cardiology

## 2019-03-03 LAB — LIPID PANEL W/O CHOL/HDL RATIO
Cholesterol, Total: 147 mg/dL (ref 100–199)
HDL: 30 mg/dL — AB (ref >39)
LDL Calculated: 77 mg/dL (ref 0–99)
Triglycerides: 201 mg/dL — ABNORMAL HIGH (ref 0–149)
VLDL Cholesterol Cal: 40 mg/dL (ref 5–40)

## 2019-03-09 ENCOUNTER — Ambulatory Visit: Payer: Self-pay | Admitting: Cardiology

## 2019-03-12 ENCOUNTER — Other Ambulatory Visit: Payer: Self-pay | Admitting: Cardiology

## 2019-05-03 ENCOUNTER — Encounter: Payer: Self-pay | Admitting: Cardiology

## 2019-05-07 DIAGNOSIS — Z950 Presence of cardiac pacemaker: Secondary | ICD-10-CM

## 2019-05-07 DIAGNOSIS — Z45018 Encounter for adjustment and management of other part of cardiac pacemaker: Secondary | ICD-10-CM | POA: Diagnosis not present

## 2019-05-07 DIAGNOSIS — I4891 Unspecified atrial fibrillation: Secondary | ICD-10-CM | POA: Diagnosis not present

## 2019-06-01 ENCOUNTER — Ambulatory Visit: Payer: Medicare Other | Admitting: Cardiology

## 2019-08-06 DIAGNOSIS — Z950 Presence of cardiac pacemaker: Secondary | ICD-10-CM | POA: Diagnosis not present

## 2019-08-06 DIAGNOSIS — Z45018 Encounter for adjustment and management of other part of cardiac pacemaker: Secondary | ICD-10-CM

## 2019-08-06 DIAGNOSIS — I4891 Unspecified atrial fibrillation: Secondary | ICD-10-CM | POA: Diagnosis not present

## 2019-08-11 ENCOUNTER — Encounter: Payer: Self-pay | Admitting: Cardiology

## 2019-08-11 DIAGNOSIS — Z45018 Encounter for adjustment and management of other part of cardiac pacemaker: Secondary | ICD-10-CM | POA: Insufficient documentation

## 2019-08-16 ENCOUNTER — Other Ambulatory Visit: Payer: Self-pay | Admitting: Cardiology

## 2019-08-17 NOTE — Telephone Encounter (Signed)
Ok to fill 

## 2019-08-21 ENCOUNTER — Other Ambulatory Visit: Payer: Self-pay

## 2019-08-21 ENCOUNTER — Encounter: Payer: Self-pay | Admitting: Cardiology

## 2019-08-21 ENCOUNTER — Ambulatory Visit: Payer: Medicare Other | Admitting: Cardiology

## 2019-08-21 VITALS — BP 109/65 | HR 69 | Ht 74.0 in | Wt 203.9 lb

## 2019-08-21 DIAGNOSIS — Z950 Presence of cardiac pacemaker: Secondary | ICD-10-CM

## 2019-08-21 DIAGNOSIS — I480 Paroxysmal atrial fibrillation: Secondary | ICD-10-CM | POA: Diagnosis not present

## 2019-08-21 DIAGNOSIS — Z5181 Encounter for therapeutic drug level monitoring: Secondary | ICD-10-CM

## 2019-08-21 DIAGNOSIS — E7800 Pure hypercholesterolemia, unspecified: Secondary | ICD-10-CM | POA: Diagnosis not present

## 2019-08-21 DIAGNOSIS — Z45018 Encounter for adjustment and management of other part of cardiac pacemaker: Secondary | ICD-10-CM | POA: Diagnosis not present

## 2019-08-21 DIAGNOSIS — I495 Sick sinus syndrome: Secondary | ICD-10-CM

## 2019-08-21 MED ORDER — FLECAINIDE ACETATE 100 MG PO TABS: 100.0000 mg | ORAL_TABLET | Freq: Two times a day (BID) | ORAL | 3 refills | Status: DC

## 2019-08-21 NOTE — Progress Notes (Signed)
Primary Physician/Referring:  Christain Sacramento, MD  Patient ID: [name], male    DOB: [birth year], 73 y.o.   MRN: [id number]  Chief Complaint  Patient presents with  . Atrial Fibrillation  . Follow-up   HPI:    [name]  is a 73 y.o. male who presents for a Follow-up for Sick sinus syndrome. Mr. Kuba Raptis is an acitve Caucasian male PMH of mild hypertension, hyperlipidemia which are well controlled and diet-controlled diabetes mellitus.  He is intolerant to statins, tolerated only Livalo. There is no family history of premature coronary artery disease.   Due to sick sinus syndrome and has had pacemaker implanted on 02/17/2015 in [place] when he developed symptomatic bradycardia and heart block. He was found to be in atrial fibrillation 2016 when he presented with fatigue. He in now on antiarrhythmic therapy with flecainide. He has been evaluated by Dr. Virl Axe.  He now presents here to the office for pacemaker check and also follow-up of atrial fibrillation, this is a 40-month office visit.  States that he is presently doing well and has not had any bleeding diathesis from Pradaxa and tolerating flecainide without any complications.  Past Medical History:  Diagnosis Date  . Arthritis   . Diabetes mellitus    exercise and diet controlled  . Hypertension   . Paroxysmal atrial fibrillation (HCC)   . Sick sinus syndrome [facility])    Past Surgical History:  Procedure Laterality Date  . JOINT REPLACEMENT  09/2011   left total knee  . JOINT REPLACEMENT  06/2006   right total knee  . KNEE CLOSED REDUCTION  12/05/2011   Procedure: CLOSED MANIPULATION KNEE;  Surgeon: Gearlean Alf;  Location: [facility] ORS;  Service: Orthopedics;  Laterality: Left;  . PACEMAKER INSERTION  02/17/2015   MDT Adapta implanted by Dr Sandi Mariscal in [place] for sick sinus syndrome   Social History   Socioeconomic History  . Marital status: Married    Spouse name: Not on file  . Number of  children: 3  . Years of education: Not on file  . Highest education level: Not on file  Occupational History  . Not on file  Social Needs  . Financial resource strain: Not on file  . Food insecurity    Worry: Not on file    Inability: Not on file  . Transportation needs    Medical: Not on file    Non-medical: Not on file  Tobacco Use  . Smoking status: Never Smoker  . Smokeless tobacco: Never Used  Substance and Sexual Activity  . Alcohol use: No    Alcohol/week: 0.0 standard drinks  . Drug use: No  . Sexual activity: Not on file  Lifestyle  . Physical activity    Days per week: Not on file    Minutes per session: Not on file  . Stress: Not on file  Relationships  . Social Herbalist on phone: Not on file    Gets together: Not on file    Attends religious service: Not on file    Active member of club or organization: Not on file    Attends meetings of clubs or organizations: Not on file    Relationship status: Not on file  . Intimate partner violence    Fear of current or ex partner: Not on file    Emotionally abused: Not on file    Physically abused: Not on file    Forced sexual activity: Not on  file  Other Topics Concern  . Not on file  Social History Narrative   Lives in [place].  Spends 3-4 months per year in [place] in an [place].   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   ROS  Review of Systems  Constitution: Negative for chills, decreased appetite, malaise/fatigue and weight gain.  Cardiovascular: Negative for dyspnea on exertion, leg swelling and syncope.  Endocrine: Negative for cold intolerance.  Hematologic/Lymphatic: Does not bruise/bleed easily.  Musculoskeletal: Negative for joint swelling.  Gastrointestinal: Negative for abdominal pain, anorexia, change in bowel habit, hematochezia and melena.  Neurological: Negative for headaches and  light-headedness.  Psychiatric/Behavioral: Negative for depression and substance abuse.  All other systems reviewed and are negative.  Objective  Blood pressure 109/65, pulse 69, height 6\' 2"  (1.88 m), weight 203 lb 14.4 oz (92.5 kg), SpO2 96 %. Body mass index is 26.18 kg/m.   Physical Exam  Constitutional: He appears well-developed and well-nourished. No distress.  HENT:  Head: Atraumatic.  Eyes: Conjunctivae are normal.  Neck: Neck supple. No JVD present. No thyromegaly present.  Cardiovascular: Normal rate, regular rhythm, normal heart sounds and intact distal pulses. Exam reveals no gallop.  No murmur heard. Pulmonary/Chest: Effort normal and breath sounds normal.  Pacemaker/ICD site noted  in the left infraclavicular fossa.    Abdominal: Soft. Bowel sounds are normal.  Musculoskeletal: Normal range of motion.  Neurological: He is alert.  Skin: Skin is warm and dry.  Psychiatric: He has a normal mood and affect.   Radiology: No results found.  Laboratory examination:   12/23/2018: Cholesterol 134, triglycerides 155, HDL 34, LDL 69.  Vitamin D 34.  Hemoglobin A1CResulted: 10/14/2018 4:16 PM [facility] Component Name Value Ref Range HEMOGLOBIN A1C 6.3 (H)  Normal:    Less than 5.7% Prediabetes: 5.7% to 6.4% Diabetes:   Greater than 6.4% <5.7 % eAG 134 MG/DL  Comprehensive Metabolic PanelResulted: 10/14/2018 3:18 PM [facility] Component Name Value Ref Range Sodium 137 135 - 146 MMOL/L Potassium 4.9 3.5 - 5.3 MMOL/L Chloride 104 98 - 110 MMOL/L CO2 25 23 - 30 MMOL/L BUN 20 8 - 24 MG/DL Glucose 897 (H) 70 - 99 MG/DL Creatinine 8.47 0.5 - 1.5 MG/DL Calcium 9.7 8.5 - 84.1 MG/DL Total Protein 7.0 6 - 8.3 G/DL Albumin  4.5 3.5 - 5 G/DL Total Bilirubin 0.8 0.1 - 1.2 MG/DL Alkaline Phosphatase 39 25 - 125 IU/L or U/L AST (SGOT) 14 5 - 40 IU/L or U/L ALT (SGPT) 14 5 - 50 IU/L or U/L Anion Gap 8 4 - 14 MMOL/L Est.  GFR Non-African American 71  Comment: GFR estimated by CKD-EPI equations, reportable up to 90 ML/MIN/1.73 M*2 >=60 ML/MIN/1.73 M*2   Lipid ProfileResulted: 10/14/2018 3:18 PM [facility] Component Name Value Ref Range LDL Direct 97 <130 mg/dL Total Cholesterol 282 25 - 199 MG/DL Triglycerides 081 (H) 10 - 150 MG/DL HDL Cholesterol 32 (L) 35 - 135 MG/DL Total Chol / HDL Cholesterol 5.1 (H) <4.5  Non-HDL Cholesterol 131  Comment:  TARGET: <(LDL-C TARGET + 30)MG/DL MG/DL TSH: 3.887 Normal  Medications   Prior to Admission medications   Medication Sig Start Date End Date Taking? Authorizing Provider  citalopram (CELEXA) 20 MG tablet Take 1 tablet by mouth daily. 10/14/18   [provider]  Coenzyme Q-10 200 MG CAPS Take 1 capsule by  mouth daily.    [provider]  dabigatran (PRADAXA) 150 MG CAPS capsule Take 150 mg by mouth 2 (two) times daily.    [provider]  flecainide (TAMBOCOR) 50 MG tablet TAKE 1 TABLET BY MOUTH TWO  TIMES DAILY 08/17/19   Yates DecampGanji, Mitchelle Sultan, MD  L-Lysine HCl 500 MG TABS Take 1 tablet by mouth daily. 05/17/17   [provider]  lisinopril (PRINIVIL,ZESTRIL) 20 MG tablet Take 20 mg by mouth at bedtime.     [provider]  LIVALO 1 MG TABS TAKE 1 TABLET BY MOUTH  DAILY 08/17/19   Yates DecampGanji, Eurydice Calixto, MD  metoprolol tartrate (LOPRESSOR) 100 MG tablet TAKE 1 TABLET BY MOUTH TWO  TIMES DAILY 08/17/19   Yates DecampGanji, Ewell Benassi, MD  rosuvastatin (CRESTOR) 10 MG tablet Take 10 mg by mouth daily.    [provider]     Current Outpatient Medications  Medication Instructions  . acetaminophen (TYLENOL) 650 mg, Oral, 2 tabs bid   . Calcium Carbonate-Vit D-Min (CALCIUM 1200 PO) Oral, Daily  . Cholecalciferol (D3 SUPER STRENGTH) 50 MCG (2000 UT) CAPS Oral, Daily  . Coenzyme Q-10 200 MG CAPS 1 capsule, Oral, Daily  . Corn Dextrin (EASY FIBER PO) Oral, Daily  . dabigatran (PRADAXA) 150 mg, Oral, 2 times daily  . flecainide  (TAMBOCOR) 100 mg, Oral, 2 times daily  . L-Lysine HCl 500 MG TABS 1 tablet, Oral, Daily  . lisinopril (ZESTRIL) 20 mg, Daily at bedtime  . LIVALO 1 MG TABS TAKE 1 TABLET BY MOUTH  DAILY  . metoprolol tartrate (LOPRESSOR) 100 MG tablet TAKE 1 TABLET BY MOUTH TWO  TIMES DAILY  . sertraline (ZOLOFT) 50 MG tablet Daily  . TURMERIC PO 800 mg, Oral, Daily    Cardiac Studies:   Exercise Treadmill Stress Test 06/09/2018: Indication: A-Fib  The patient exercised on Bruce protocol for  7:00 min. Patient achieved  8.03 METS and reached HR  121 bpm, which is   82% of maximum age-predicted HR.  Stress test terminated due to Fatigue.  Exercise capacity was normal. HR Response to Exercise:  See below.BP Response to Exercise: Normal resting BP- appropriate response. Chest Pain: none. Arrhythmias: none. 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 patietn developed  Atrial paced rhythm, long first degree AV block, IVCD and T wave change likely related to IVCD, during exercsie, During recovery period, he had  atrial paced rhythm with tachycardia, one episdoe of possible Mobitz type 1 AV block, followed by A_V paced rhythm. EKG is nondiagnostic for ischemic changes. No tachyarrhtyhmias seen.  Impression: Exercise treadmill stress test nondiagnostic for ischemia Clinical correlation recommended.   Assessment     ICD-10-CM   1. Encounter for care of pacemaker  Z45.018   2. Pacemaker Medtronic Adapta ADDR01 in situ 02/17/2015  Z95.0   3. Sick sinus syndrome (HCC)  I49.5   4. Paroxysmal atrial fibrillation (HCC)  I48.0 flecainide (TAMBOCOR) 100 MG tablet    PCV CARDIAC STRESS TEST   CHA2DS2-VASc Score is 3 with yearly risk of stroke of 3.2 %.   5. Hypercholesteremia  E78.00   6. Therapeutic drug monitoring  Z51.81 PCV CARDIAC STRESS TEST    Scheduled Remote pacemaker check  08/05/2019:  There were 540 atrial high rate episodes detected. The longest lasted  0:00:45:28 in duration. There was a 6.9 % cumulative atrial arrhythmia burden. EGMs show AT/AF/VP. No VHR episodes.  Battery longevity is 4.5-7.0 years. RA pacing  is 98.1 %, RV pacing is 14.2 %.  Scheduled In office pacemaker check 08/21/2019:  There were frequent episodes of atrial high rate.  Latest episode on 08/20/2019 lasted for hours, EGM suggest atrial fibrillation.  No HVR.  Atrial arrhythmia burden 6.9%.  Normal impedance and thresholds.  Underlying sinus arrest, ventricular escape at 30 bpm.  Pacer dependent.  Longevity 4. 5-7 years, AP >99, VP <15%. Normal pacemaker function.  Recommendations:   He is having approximately 8% atrial arrhythmia burden.  Cardiac comass reveals paroxysmal episodes of atrial fibrillation with controlled ventricular response, patient having at least 4-6 sustained episodes of atrial fibrillation.  Otherwise his pacemaker is functioning normally.  He is presently tolerating anticoagulation with Pradaxa and on Flecainide. Although Asymptomatic from A. Fib standpoint, I will try Flecainide 100 mg BID (increase from 50 mg BID) and f/u AF burden on remotes.   With regard to hyperlipidemia, she was unable to tolerate Crestor, this is being managed by his PCP, now on Livalo and tolerating this well.  Advised him to add Metamucil or natural fiber with the major meal every day and also added flaxseed flakes which will help with improving lipid status. I have reviewed his labs.   Schedule for routine treadmill stress test to exclude inducible arrhythmias. Otherwise I will see him back in 1 year.   Yates DecampJay Daemion Mcniel, MD, [facility] County Memorial HospitalFACC 08/21/2019, 3:12 PM [facility] Pager: [phone] Office: [phone] If no answer Cell [phone]

## 2019-09-21 ENCOUNTER — Other Ambulatory Visit: Payer: Self-pay | Admitting: Cardiology

## 2019-09-21 ENCOUNTER — Ambulatory Visit (INDEPENDENT_AMBULATORY_CARE_PROVIDER_SITE_OTHER): Payer: Medicare Other

## 2019-09-21 ENCOUNTER — Other Ambulatory Visit: Payer: Self-pay

## 2019-09-21 DIAGNOSIS — Z5181 Encounter for therapeutic drug level monitoring: Secondary | ICD-10-CM | POA: Diagnosis not present

## 2019-09-21 DIAGNOSIS — I480 Paroxysmal atrial fibrillation: Secondary | ICD-10-CM | POA: Diagnosis not present

## 2019-09-25 ENCOUNTER — Encounter: Payer: Self-pay | Admitting: Cardiology

## 2019-09-28 ENCOUNTER — Telehealth: Payer: Self-pay

## 2019-09-28 NOTE — Telephone Encounter (Signed)
Set up device check please

## 2019-09-28 NOTE — Telephone Encounter (Signed)
Pt called he has received the results on my chart about stress test; He says he is having fatigue/sob on exertion and it says that you can change sensitivity  on Pacer if he is experiencing that; Please advise

## 2019-09-28 NOTE — Telephone Encounter (Signed)
Done

## 2019-09-30 ENCOUNTER — Other Ambulatory Visit: Payer: Self-pay

## 2019-09-30 ENCOUNTER — Ambulatory Visit (INDEPENDENT_AMBULATORY_CARE_PROVIDER_SITE_OTHER): Payer: Medicare Other | Admitting: Cardiology

## 2019-09-30 VITALS — BP 113/76 | HR 84 | Temp 97.5°F | Ht 74.0 in | Wt 203.8 lb

## 2019-09-30 DIAGNOSIS — I495 Sick sinus syndrome: Secondary | ICD-10-CM | POA: Diagnosis not present

## 2019-09-30 DIAGNOSIS — R0600 Dyspnea, unspecified: Secondary | ICD-10-CM | POA: Diagnosis not present

## 2019-09-30 DIAGNOSIS — Z950 Presence of cardiac pacemaker: Secondary | ICD-10-CM

## 2019-09-30 DIAGNOSIS — Z45018 Encounter for adjustment and management of other part of cardiac pacemaker: Secondary | ICD-10-CM

## 2019-09-30 DIAGNOSIS — R0609 Other forms of dyspnea: Secondary | ICD-10-CM

## 2019-09-30 NOTE — Progress Notes (Signed)
ICD-10-CM   1. Encounter for care of pacemaker  Z45.018   2. Sick sinus syndrome (HCC)  I49.5   3. Pacemaker Medtronic Adapta ADDR01 in situ 02/17/2015  Z95.0   4. Dyspnea on exertion  R06.00     Patient scheduled for device clinic visit due to decreased exercise tolerance and recent treadmill exercise stress test revealing suboptimal heart rate response to exercise.  Scheduled Remote pacemaker check  08/05/2019:  There were 540 atrial high rate episodes detected. The longest lasted 0:00:45:28 in duration. There was a 6.9 % cumulative atrial arrhythmia burden. EGMs show AT/AF/VP. No VHR episodes.  Battery longevity is 4.5-7.0 years. RA pacing is 98.1 %, RV pacing is 14.2 %.  Scheduled In office pacemaker check 09/30/19:  There were frequent, brief AHR episodes. No further sustained A. Fib since 08/20/2019. No HVR.  Atrial arrhythmia burden 6.9% on 08/21/19 and since <1% (short monitoring). Normal impedance and thresholds.  Underlying sinus arrest, ventricular escape at 30 bpm. Wenckebach at HR of 95/min. MVP on. Pacer dependent.   Changed activity threshold to low to improve HR response. (expect higher VP).  Longevity 4-7 years, AP >99, VP <11.7%.  Hopefully this should improve his exercise tolerance, patient will let us know how he feels over the next 3 months.

## 2019-10-05 ENCOUNTER — Telehealth: Payer: Self-pay

## 2019-10-05 NOTE — Telephone Encounter (Signed)
-----   Message from Adrian Prows, MD sent at 10/04/2019  9:40 AM EDT ----- Regarding: Pacemaker Wrong MR number and wrong upload into [employer]. Please fix and delete wrong file.. Correct his MR #, Fill blanks in their system. Poor quality

## 2019-10-05 NOTE — Telephone Encounter (Signed)
[employer] Is aware and will get it done

## 2020-01-07 ENCOUNTER — Other Ambulatory Visit: Payer: Self-pay

## 2020-01-07 MED ORDER — METOPROLOL TARTRATE 100 MG PO TABS: 100.0000 mg | ORAL_TABLET | Freq: Two times a day (BID) | ORAL | 3 refills | Status: DC

## 2020-08-23 ENCOUNTER — Ambulatory Visit: Payer: Medicare Other | Admitting: Cardiology

## 2020-09-04 DIAGNOSIS — I480 Paroxysmal atrial fibrillation: Secondary | ICD-10-CM

## 2020-09-05 MED ORDER — FLECAINIDE ACETATE 100 MG PO TABS: 100.0000 mg | ORAL_TABLET | Freq: Two times a day (BID) | ORAL | 3 refills | Status: DC

## 2020-09-05 NOTE — Telephone Encounter (Signed)
Refill request

## 2020-09-09 ENCOUNTER — Ambulatory Visit: Payer: Medicare PPO | Admitting: Cardiology

## 2020-09-09 ENCOUNTER — Encounter: Payer: Self-pay | Admitting: Cardiology

## 2020-09-09 ENCOUNTER — Other Ambulatory Visit: Payer: Self-pay

## 2020-09-09 VITALS — BP 113/65 | HR 65 | Resp 15 | Ht 74.0 in | Wt 198.0 lb

## 2020-09-09 DIAGNOSIS — E782 Mixed hyperlipidemia: Secondary | ICD-10-CM

## 2020-09-09 DIAGNOSIS — Z950 Presence of cardiac pacemaker: Secondary | ICD-10-CM

## 2020-09-09 DIAGNOSIS — I480 Paroxysmal atrial fibrillation: Secondary | ICD-10-CM

## 2020-09-09 DIAGNOSIS — I495 Sick sinus syndrome: Secondary | ICD-10-CM

## 2020-09-09 MED ORDER — OMEGA-3-ACID ETHYL ESTERS 1 G PO CAPS: 1.0000 g | ORAL_CAPSULE | Freq: Two times a day (BID) | ORAL | 3 refills | Status: DC

## 2020-09-09 NOTE — Progress Notes (Signed)
Primary Physician/Referring:  Christain Sacramento, MD  Patient ID: [name], male    DOB: [birth year], 74 y.o.   MRN: [id number]  Chief Complaint  Patient presents with   Follow-up    1 year   Atrial Fibrillation   HPI:    Jaime Grizzell  is a 74 y.o. Caucasian male PMH of mild hypertension, hyperlipidemia which are well controlled and diet-controlled diabetes mellitus.  He is intolerant to statins, tolerated only Livalo.Due to sick sinus syndrome and has had pacemaker implanted on 02/17/2015 in [place] when he developed symptomatic bradycardia and heart block. He was found to be in atrial fibrillation 2016 when he presented with fatigue, now on antiarrhythmic therapy with flecainide, anti-coagulated with Pradaxa.  The patient presents for annual visit for atrial fibrillation. He is overall doing well. He remains asymptomatic. Denies chest pain, dyspnea, leg swelling, dizziness, syncope. Denies GI bleeding.   Past Medical History:  Diagnosis Date   Arthritis    Diabetes mellitus    exercise and diet controlled   Encounter for care of pacemaker    Hypertension    Paroxysmal atrial fibrillation [facility])    Sick sinus syndrome [facility])    Past Surgical History:  Procedure Laterality Date   JOINT REPLACEMENT  09/2011   left total knee   JOINT REPLACEMENT  06/2006   right total knee   KNEE CLOSED REDUCTION  12/05/2011   Procedure: CLOSED MANIPULATION KNEE;  Surgeon: Gearlean Alf;  Location: [facility];  Service: Orthopedics;  Laterality: Left;   PACEMAKER INSERTION  02/17/2015   MDT Adapta implanted by Dr Sandi Mariscal in [place] for sick sinus syndrome   Social History   Socioeconomic History   Marital status: Married    Spouse name: Not on file   Number of children: 3   Years of education: Not on file   Highest education level: Not on file  Occupational History   Not on file  Tobacco Use   Smoking status: Never Smoker   Smokeless tobacco: Never Used   Vaping Use   Vaping Use: Never used  Substance and Sexual Activity   Alcohol use: No    Alcohol/week: 0.0 standard drinks   Drug use: No   Sexual activity: Not on file  Other Topics Concern   Not on file  Social History Narrative   Lives in [place].  Spends 3-4 months per year in [place] in an [place].   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   Social Determinants of Health   Financial Resource Strain:    Difficulty of Paying Living Expenses: Not on file  Food Insecurity:    Worried About Charity fundraiser in the Last Year: Not on file   [employer] of Food in the Last Year: Not on file  Transportation Needs:    Lack of Transportation (Medical): Not on file   Lack of Transportation (Non-Medical): Not on file  Physical Activity:    Days of Exercise per Week: Not on file   Minutes of Exercise per Session: Not on file  Stress:    Feeling of Stress : Not on file  Social Connections:    Frequency of Communication with Friends and Family: Not on file   Frequency of Social Gatherings with Friends and Family: Not on file   Attends Religious Services: Not on file   Active Member of Clubs or Organizations: Not on  file   Attends Archivist Meetings: Not on file   Marital Status: Not on file  Intimate Partner Violence:    Fear of Current or Ex-Partner: Not on file   Emotionally Abused: Not on file   Physically Abused: Not on file   Sexually Abused: Not on file   ROS  Review of Systems  Constitutional: Negative for chills, decreased appetite, malaise/fatigue and weight gain.  Cardiovascular: Negative for dyspnea on exertion, leg swelling and syncope.  Endocrine: Negative for cold intolerance.  Hematologic/Lymphatic: Does not bruise/bleed easily.  Musculoskeletal: Negative for joint swelling.  Gastrointestinal: Negative for abdominal pain, anorexia,  change in bowel habit, hematochezia and melena.  Neurological: Negative for headaches and light-headedness.  Psychiatric/Behavioral: Negative for depression and substance abuse.  All other systems reviewed and are negative.  Objective  Blood pressure 113/65, pulse 65, resp. rate 15, height _0  (1.88 m), weight 89.8 kg, SpO2 98 %. Body mass index is 25.42 kg/m.   Physical Exam Constitutional:      General: He is not in acute distress.    Appearance: He is well-developed.  HENT:     Head: Atraumatic.  Eyes:     Conjunctiva/sclera: Conjunctivae normal.  Neck:     Thyroid: No thyromegaly.     Vascular: No JVD.  Cardiovascular:     Rate and Rhythm: Normal rate and regular rhythm.     Pulses: Intact distal pulses.     Heart sounds: Normal heart sounds. No murmur heard.  No gallop.   Pulmonary:     Effort: Pulmonary effort is normal.     Breath sounds: Normal breath sounds.  Abdominal:     General: Bowel sounds are normal.     Palpations: Abdomen is soft.  Musculoskeletal:        General: Normal range of motion.     Cervical back: Neck supple.  Skin:    General: Skin is warm and dry.  Neurological:     Mental Status: He is alert.    Radiology: No results found.  Laboratory examination:   12/23/2018: Cholesterol 134, triglycerides 155, HDL 34, LDL 69.  Vitamin D 34.  Labs From Care Everywhere 04/14/2020 A1C: 6.9%  Hemoglobin 13.5  Hematocrit 38.8  CMP: Sodium 139 Potassium 4.9 Glucose 149 BUN 24 Creatinine 1.20 EGFR 60 Albumin 4.2 ALT 18 AST 16 Alk Phos 36  Lipid Profile: Triglycerides 237 LDL Direct 72 HDL Cholesterol 25 Total Chol/HDL Cholesterol 5.8 Non-HDL Cholesterol 120  Medications   Allergies  Allergen Reactions   Atorvastatin     myalgias   Current Outpatient Medications on File Prior to Visit  Medication Sig Dispense Refill   acetaminophen (TYLENOL) 650 MG CR tablet Take 650 mg by mouth. 2 tabs bid     Calcium Carbonate-Vit D-Min  (CALCIUM 1200 PO) Take by mouth daily.     Cholecalciferol (D3 SUPER STRENGTH) 50 MCG (2000 UT) CAPS Take by mouth daily.     Coenzyme Q-10 200 MG CAPS Take 1 capsule by mouth daily.     Corn Dextrin (EASY FIBER PO) Take by mouth daily.     flecainide (TAMBOCOR) 100 MG tablet Take 1 tablet (100 mg total) by mouth 2 (two) times daily. 180 tablet 3   L-Lysine HCl 500 MG TABS Take 1 tablet by mouth daily.     lisinopril (PRINIVIL,ZESTRIL) 20 MG tablet Take 20 mg by mouth at bedtime.      metoprolol tartrate (LOPRESSOR) 100 MG tablet Take 1 tablet (100  mg total) by mouth 2 (two) times daily. 180 tablet 3   PRADAXA 150 MG CAPS capsule TAKE 1 CAPSULE BY MOUTH TWO TIMES DAILY 180 capsule 3   sertraline (ZOLOFT) 50 MG tablet daily.     TURMERIC PO Take 800 mg by mouth daily.     LIVALO 1 MG TABS TAKE 1 TABLET BY MOUTH  DAILY (Patient not taking: Reported on 09/09/2020) 90 tablet 3   No current facility-administered medications on file prior to visit.     Cardiac Studies:   Exercise treadmill stress test 09/21/2019: Exercise treadmill stress test was performed using Bruce protocol. Patient reached 7 METS, and 67% of age predicted maximum heart rate. Exercise capacity was low. Chest pain not reported. Heart rate and hemodynamic response were normal.  Stress test is non-diagnostic due to low exercise capacity. In addition, it showed atrial paced rhythm, RBBB, and LAFB at 67% age predicted maximum heart rate. No arrhythmia noted.  Consider alternate ischemia testing, if clinically indicated.   Scheduled In office pacemaker check 08/21/2019:  There were frequent episodes of atrial high rate.  Latest episode on 08/20/2019 lasted for hours, EGM suggest atrial fibrillation.  No HVR.  Atrial arrhythmia burden 6.9%.  Normal impedance and thresholds.  Underlying sinus arrest, ventricular escape at 30 bpm.  Pacer dependent.  Longevity 4. 5-7 years, AP >99, VP <15%. Normal pacemaker function.  Scheduled  Remote pacemaker check 08.25.21:  There were 139 atrial high rate episodes detected. The longest lasted 00:03:28:32 in duration (longest episode unavailable, likely paroxysmal atrial fibrillation). Review of available EGMs demonstrates false mode switch and atrial tachycardia. There was a 0.2 % cumulative atrial arrhythmia burden. There were 0 high ventricular rate episodes detected. Battery longevity is 4.5 years. RA pacing is 99.9 %, RV pacing is 44.7 %.  EKG:   EKG 09/09/2020: AV paced rhythm at the rate of 74 bpm.   Assessment     ICD-10-CM   1. Paroxysmal atrial fibrillation (HCC)  I48.0 EKG 12-Lead  2. Sick sinus syndrome (HCC)  I49.5   3. Encounter for care of pacemaker  Z45.018    There are no discontinued medications.  Meds ordered this encounter  Medications   omega-3 acid ethyl esters (LOVAZA) 1 g capsule    Sig: Take 1 capsule (1 g total) by mouth 2 (two) times daily.    Dispense:  180 capsule    Refill:  3    Recommendations:   Marqus Macphee is a 74 y.o. male Caucasian male PMH of mild hypertension, hyperlipidemia and diet-controlled diabetes mellitus.  He is intolerant to statins, tolerated only Livalo.Due to sick sinus syndrome and has had pacemaker implanted on 02/17/2015 in [place] when he developed symptomatic bradycardia and heart block. He was found to be in atrial fibrillation 2016 when he presented with fatigue, now on antiarrhythmic therapy with flecainide, anti-coagulated with Pradaxa.  He presents today for annual follow up visit. Overall he is doing well and remains asymptomatic. At his last visit his dose of flecainide was increased to 100 mg daily. He is tolerating this well. He is anti-coagulated on Pradaxa without bleeding diathesis. His pacemaker remote transmission 08/10/2020 was reviewed. There was a 0.2 % cumulative atrial arrhythmia burden. The longest episode being 3.5 hours, likely paroxysmal atrial fibrillation.  A exercise stress test was  ordered to evaluate for inducible arrhythmias. The results of this were reviewed. The test was non-diagnostic due to low exercise capacity and patient not reaching target heart rate, but showed no  arrhythmia.  His blood pressure is very well controlled. I reviewed most recent labs from April 2021. His diabetes is well controlled. LDL is well controlled, however triglycerides are elevated. He is presently on Livalo as he was unable to tolerate any other statin. I will start him on a low dose Lovaza along with Livalo. We also discussed dietary modifications to improve triglyceride levels. He is scheduled to see his PCP in November and will have a repeat lipid profile at that time. I will review these results. We will see him back in 6 months.  Blair Heys, PA Student 09/09/20 2:29 PM   Patient seen and examined in conjunction with Blair Heys, PA second year student at [employer].  Time spent is in direct patient face to face encounter not including the teaching and training involved.    Adrian Prows, MD, [facility] 09/10/2020, 9:47 AM Office: [phone]

## 2020-12-05 ENCOUNTER — Other Ambulatory Visit: Payer: Self-pay

## 2020-12-23 ENCOUNTER — Other Ambulatory Visit: Payer: Self-pay | Admitting: Cardiology

## 2021-01-26 ENCOUNTER — Other Ambulatory Visit: Payer: Self-pay | Admitting: Cardiology

## 2021-03-09 ENCOUNTER — Other Ambulatory Visit: Payer: Self-pay

## 2021-03-09 ENCOUNTER — Ambulatory Visit: Payer: Medicare PPO | Admitting: Cardiology

## 2021-03-09 ENCOUNTER — Encounter: Payer: Self-pay | Admitting: Cardiology

## 2021-03-09 VITALS — BP 101/69 | HR 87 | Temp 98.8°F | Resp 17 | Ht 74.0 in | Wt 215.4 lb

## 2021-03-09 DIAGNOSIS — I495 Sick sinus syndrome: Secondary | ICD-10-CM

## 2021-03-09 DIAGNOSIS — E7800 Pure hypercholesterolemia, unspecified: Secondary | ICD-10-CM

## 2021-03-09 DIAGNOSIS — R9431 Abnormal electrocardiogram [ECG] [EKG]: Secondary | ICD-10-CM

## 2021-03-09 DIAGNOSIS — I480 Paroxysmal atrial fibrillation: Secondary | ICD-10-CM

## 2021-03-09 DIAGNOSIS — Z950 Presence of cardiac pacemaker: Secondary | ICD-10-CM

## 2021-03-09 NOTE — Progress Notes (Signed)
Primary Physician/Referring:  Christain Sacramento, MD  Patient ID: [name], male    DOB: 1946-08-09, 75 y.o.   MRN: [id number]  Chief Complaint  Patient presents with  . Follow-up    6 MONTH  . Atrial Fibrillation   HPI:    [name]  is a 74 y.o. [place] male PMH of mild hypertension, hyperlipidemia and diet-controlled diabetes mellitus.  He is intolerant to statins, tolerated only Livalo.Due to sick sinus syndrome and has had pacemaker implanted on 02/17/2015 in [place] when he developed symptomatic bradycardia and heart block. He was found to be in atrial fibrillation 2016 when he presented with fatigue, now on antiarrhythmic therapy with flecainide, anti-coagulated with Pradaxa.  He presents today for 35-month follow up visit. Overall he is doing well and remains asymptomatic.  He is presently tolerating flecainide 100 mg twice daily, Denies chest pain, dyspnea, leg swelling, dizziness, syncope. Denies GI bleeding.   Past Medical History:  Diagnosis Date  . Arthritis   . Diabetes mellitus    exercise and diet controlled  . Encounter for care of pacemaker   . Hypertension   . Paroxysmal atrial fibrillation (HCC)   . Sick sinus syndrome [facility])    Past Surgical History:  Procedure Laterality Date  . JOINT REPLACEMENT  09/2011   left total knee  . JOINT REPLACEMENT  06/2006   right total knee  . KNEE CLOSED REDUCTION  12/05/2011   Procedure: CLOSED MANIPULATION KNEE;  Surgeon: Gearlean Alf;  Location: [facility];  Service: Orthopedics;  Laterality: Left;  . PACEMAKER INSERTION  02/17/2015   MDT Adapta implanted by Dr Sandi Mariscal in [place] for sick sinus syndrome   Social History   Tobacco Use  . Smoking status: Never Smoker  . Smokeless tobacco: Never Used  Substance Use Topics  . Alcohol use: No    Alcohol/week: 0.0 standard drinks  Marital Status: Married    ROS  Review of Systems  Cardiovascular: Negative for chest pain, dyspnea on exertion and leg  swelling.  Gastrointestinal: Negative for melena.   Objective  Blood pressure 101/69, pulse 87, temperature 98.8 F (37.1 C), temperature source Temporal, resp. rate 17, height $RemoveBe'6\' 2"'jhvQKKMYQ$  (1.88 m), weight 215 lb 6.4 oz (97.7 kg), SpO2 98 %. Body mass index is 27.66 kg/m.  Vitals with BMI 03/09/2021 09/09/2020 09/30/2019  Height $Remov'6\' 2"'nzyBim$  $Remove'6\' 2"'AWXzMAZ$  $RemoveB'6\' 2"'uaLsOrNJ$   Weight 215 lbs 6 oz 198 lbs 203 lbs 13 oz  BMI 27.64 09.62 83.66  Systolic 294 765 465  Diastolic 69 65 76  Pulse 87 65 84     Physical Exam Constitutional:      General: He is not in acute distress.    Appearance: He is well-developed.  HENT:     Head: Atraumatic.  Eyes:     Conjunctiva/sclera: Conjunctivae normal.  Neck:     Thyroid: No thyromegaly.     Vascular: No JVD.  Cardiovascular:     Rate and Rhythm: Normal rate and regular rhythm.     Pulses: Intact distal pulses.     Heart sounds: Normal heart sounds. No murmur heard. No gallop.   Pulmonary:     Effort: Pulmonary effort is normal.     Breath sounds: Normal breath sounds.  Abdominal:     General: Bowel sounds are normal.     Palpations: Abdomen is soft.  Musculoskeletal:        General: Normal range of motion.     Cervical back: Neck supple.  Skin:    General: Skin is warm and dry.  Neurological:     Mental Status: He is alert.    Radiology: No results found.  Laboratory examination:    Labs 02/10/2021:  Potassium 4.0, BUN 15, creatinine 1.07, serum glucose 104 mg, CMP normal.  eGFR 73 mL.  A1c 7.3%.  Labs 10/19/2020:  Total cholesterol 104, triglycerides 158, HDL 29, LDL 49.  A1c 6.1%.  12/23/2018: Cholesterol 134, triglycerides 155, HDL 34, LDL 69.  Vitamin D 34.  Labs From Care Everywhere 04/14/2020 A1C: 6.9%  Hemoglobin 13.5  Hematocrit 38.8  CMP: Sodium 139 Potassium 4.9 Glucose 149 BUN 24 Creatinine 1.20 EGFR 60 Albumin 4.2 ALT 18 AST 16 Alk Phos 36  Lipid Profile: Triglycerides 237 LDL Direct 72 HDL Cholesterol 25 Total Chol/HDL  Cholesterol 5.8 Non-HDL Cholesterol 120  Medications   Allergies  Allergen Reactions  . Atorvastatin     myalgias   Current Outpatient Medications on File Prior to Visit  Medication Sig Dispense Refill  . acetaminophen (TYLENOL) 650 MG CR tablet Take 650 mg by mouth. 2 tabs bid    . Calcium Carbonate-Vit D-Min (CALCIUM 1200 PO) Take by mouth daily.    . Cholecalciferol (D3 SUPER STRENGTH) 50 MCG (2000 UT) CAPS Take by mouth daily.    . Coenzyme Q-10 200 MG CAPS Take 1 capsule by mouth daily.    . flecainide (TAMBOCOR) 100 MG tablet Take 1 tablet (100 mg total) by mouth 2 (two) times daily. 180 tablet 3  . L-Lysine HCl 500 MG TABS Take 1 tablet by mouth daily.    Marland Kitchen levocetirizine (XYZAL) 5 MG tablet Take 1 tablet by mouth daily.    Marland Kitchen lisinopril (PRINIVIL,ZESTRIL) 20 MG tablet Take 20 mg by mouth at bedtime.    . metFORMIN (GLUCOPHAGE-XR) 500 MG 24 hr tablet Take 1 tablet by mouth daily.    . metoprolol tartrate (LOPRESSOR) 100 MG tablet TAKE 1 TABLET BY MOUTH TWICE A DAY 180 tablet 3  . omega-3 acid ethyl esters (LOVAZA) 1 g capsule Take 1 capsule (1 g total) by mouth 2 (two) times daily. 180 capsule 3  . PRADAXA 150 MG CAPS capsule TAKE 1 CAPSULE BY MOUTH TWICE A DAY 180 capsule 2  . psyllium (METAMUCIL) 28 % packet Take 1 packet by mouth daily.    . rosuvastatin (CRESTOR) 10 MG tablet Take 10 mg by mouth daily.    . sertraline (ZOLOFT) 50 MG tablet daily.    . TURMERIC PO Take 800 mg by mouth daily.     No current facility-administered medications on file prior to visit.     Cardiac Studies:   Exercise treadmill stress test 09/21/2019: Exercise treadmill stress test was performed using Bruce protocol. Patient reached 7 METS, and 67% of age predicted maximum heart rate. Exercise capacity was low. Chest pain not reported. Heart rate and hemodynamic response were normal.  Stress test is non-diagnostic due to low exercise capacity. In addition, it showed atrial paced rhythm, RBBB,  and LAFB at 67% age predicted maximum heart rate. No arrhythmia noted.  Consider alternate ischemia testing, if clinically indicated.   Device Clinic: Pacemaker Medtronic Adapta [id number] in situ 02/17/2015  Scheduled Remote pacemaker check 02/07/2021:  There were 30 atrial high rate episodes. The longest lasted 1 day in duration, likely paroxysmal atrial fibrillation on 02/01/2021 with controlled ventricular response. There was a 0.1 % cumulative atrial arrhythmia burden. There were 0 high ventricular rate episodes detected. Battery longevity is 3.5 years. RA  pacing is 99.9 %, RV pacing is 65.9 %.  EKG:   EKG 03/09/2021: Atrially paced and ventricularly sensed rhythm at the rate of 64 bpm.  Incomplete right bundle branch block.  Marked T wave abnormality, probably related to paced rhythm and memory affect, cannot exclude inferior and anterolateral ischemia.   No significant change from EKG 09/09/2020: AV paced rhythm at the rate of 74 bpm.   Assessment     ICD-10-CM   1. Paroxysmal atrial fibrillation (HCC)  I48.0 EKG 12-Lead    PCV ECHOCARDIOGRAM COMPLETE    PCV MYOCARDIAL PERFUSION WITH LEXISCAN  2. Pacemaker Medtronic Adapta ADDR01 in situ 02/17/2015  Z95.0   3. Sick sinus syndrome (HCC)  I49.5   4. Hypercholesteremia  E78.00   5. Nonspecific abnormal electrocardiogram (ECG) (EKG)  R94.31 PCV ECHOCARDIOGRAM COMPLETE    PCV MYOCARDIAL PERFUSION WITH LEXISCAN   Medications Discontinued During This Encounter  Medication Reason  . LIVALO 1 MG TABS Error  . Corn Dextrin (EASY FIBER PO) Error    No orders of the defined types were placed in this encounter.   Recommendations:   [name] is a 75 y.o. male Caucasian male PMH of mild hypertension, hyperlipidemia and diet-controlled diabetes mellitus.  He is intolerant to statins, tolerated only Livalo.Due to sick sinus syndrome and has had pacemaker implanted on 02/17/2015 in [place] when he developed symptomatic bradycardia and heart  block. He was found to be in atrial fibrillation 2016 when he presented with fatigue, now on antiarrhythmic therapy with flecainide, anti-coagulated with Pradaxa.  He presents today for 23-month follow up visit. Overall he is doing well and remains asymptomatic.  He is presently tolerating flecainide 100 mg twice daily, pacemaker data reveals <1% atrial fibrillation burden.  In view of his age, diabetes mellitus, hypertension and hyperlipidemia, he has not had a imaging study in the past, routine treadmill stress test was nondiagnostic a year and a half ago due to low heart rate and hence we will schedule him for a Lexiscan stress nuclear stress test.  I will also repeat echocardiogram to establish a baseline.  Last echocardiogram was done about 5 years ago or so when he had pacemaker implanted in [place].  Pacemaker is functioning normally.  He has markedly abnormal EKG which I suspect is probably a memory effect from ventricular pacing today he is not V paced.  Blood pressure is well controlled, lipids are under excellent control and he is tolerating Crestor.  No changes in the medications were done today, I will see him back in 6 months for follow-up.   Adrian Prows, MD, [facility] 03/09/2021, 11:35 AM Office: [phone]

## 2021-03-27 ENCOUNTER — Other Ambulatory Visit: Payer: Self-pay

## 2021-03-27 ENCOUNTER — Ambulatory Visit: Payer: Medicare PPO

## 2021-03-27 DIAGNOSIS — I480 Paroxysmal atrial fibrillation: Secondary | ICD-10-CM

## 2021-03-27 DIAGNOSIS — R9431 Abnormal electrocardiogram [ECG] [EKG]: Secondary | ICD-10-CM

## 2021-04-03 ENCOUNTER — Other Ambulatory Visit: Payer: Self-pay

## 2021-04-03 ENCOUNTER — Ambulatory Visit: Payer: Medicare PPO

## 2021-04-03 DIAGNOSIS — I480 Paroxysmal atrial fibrillation: Secondary | ICD-10-CM

## 2021-04-03 DIAGNOSIS — R9431 Abnormal electrocardiogram [ECG] [EKG]: Secondary | ICD-10-CM

## 2021-04-14 ENCOUNTER — Other Ambulatory Visit: Payer: Self-pay

## 2021-04-14 ENCOUNTER — Other Ambulatory Visit (HOSPITAL_BASED_OUTPATIENT_CLINIC_OR_DEPARTMENT_OTHER): Payer: Self-pay

## 2021-04-14 ENCOUNTER — Ambulatory Visit: Payer: Medicare PPO | Attending: Internal Medicine

## 2021-04-14 DIAGNOSIS — Z23 Encounter for immunization: Secondary | ICD-10-CM

## 2021-04-14 MED ORDER — COVID-19 MRNA VACC (MODERNA) 100 MCG/0.5ML IM SUSP
INTRAMUSCULAR | 0 refills | Status: DC
  Filled 2021-04-14: qty 0.25, 1d supply, fill #0

## 2021-04-14 NOTE — Progress Notes (Signed)
   Covid-19 Vaccination Clinic  Name:  [name]    MRN: [id number] DOB: 1946-02-01  04/14/2021  [name] was observed post Covid-19 immunization for 15 minutes without incident. He was provided with Vaccine Information Sheet and instruction to access the V-Safe system.   [name] was instructed to call [phone] with any severe reactions post vaccine: Marland Kitchen Difficulty breathing  . Swelling of face and throat  . A fast heartbeat  . A bad rash all over body  . Dizziness and weakness   Immunizations Administered    Name Date Dose VIS Date Route   Moderna Covid-19 Booster Vaccine 04/14/2021 11:06 AM 0.25 mL 10/05/2020 Intramuscular   Manufacturer: Moderna   Lot: [id number]   NDC: 07371-062-69

## 2021-08-31 ENCOUNTER — Other Ambulatory Visit: Payer: Self-pay | Admitting: Cardiology

## 2021-08-31 DIAGNOSIS — E782 Mixed hyperlipidemia: Secondary | ICD-10-CM

## 2021-08-31 DIAGNOSIS — I480 Paroxysmal atrial fibrillation: Secondary | ICD-10-CM

## 2021-08-31 NOTE — Telephone Encounter (Signed)
Okay to refill flecainide acetate 100mg ?

## 2021-09-11 ENCOUNTER — Ambulatory Visit: Payer: Medicare PPO | Admitting: Cardiology

## 2021-09-19 ENCOUNTER — Ambulatory Visit: Payer: Medicare PPO | Admitting: Cardiology

## 2021-09-19 ENCOUNTER — Other Ambulatory Visit: Payer: Self-pay

## 2021-09-19 ENCOUNTER — Encounter: Payer: Self-pay | Admitting: Cardiology

## 2021-09-19 VITALS — BP 101/68 | HR 81 | Temp 97.8°F | Resp 17 | Ht 74.0 in | Wt 201.8 lb

## 2021-09-19 DIAGNOSIS — E782 Mixed hyperlipidemia: Secondary | ICD-10-CM

## 2021-09-19 DIAGNOSIS — I495 Sick sinus syndrome: Secondary | ICD-10-CM

## 2021-09-19 DIAGNOSIS — I480 Paroxysmal atrial fibrillation: Secondary | ICD-10-CM

## 2021-09-19 DIAGNOSIS — Z950 Presence of cardiac pacemaker: Secondary | ICD-10-CM

## 2021-09-19 NOTE — Progress Notes (Signed)
Primary Physician/Referring:  Christain Sacramento, MD  Patient ID: [name], male    DOB: [birth year], 75 y.o.   MRN: [id number]  Chief Complaint  Patient presents with   Paroxysmal atrial fibrillation ([place])   SINUS NODE DYSFUNCTION   Hyperlipidemia   HPI:    [name]  is a 75 y.o. Caucasian male PMH of mild hypertension, hyperlipidemia and diet-controlled diabetes mellitus.  He is intolerant to statins, tolerated only Livalo. Due to sick sinus syndrome and has had pacemaker implanted on 02/17/2015 in [place] when he developed symptomatic bradycardia and heart block. He was found to be in atrial fibrillation 2016 when he presented with fatigue, now on antiarrhythmic therapy with flecainide, anti-coagulated with Pradaxa.  He presents today for 40-monthfollow up visit. Overall he is doing well and remains asymptomatic.  He is presently tolerating flecainide 100 mg twice daily, Denies chest pain, dyspnea, leg swelling, dizziness, syncope. Denies GI bleeding.   Past Medical History:  Diagnosis Date   Arthritis    Diabetes mellitus    exercise and diet controlled   Encounter for care of pacemaker    Hypertension    Paroxysmal atrial fibrillation ([facility]    Sick sinus syndrome ([facility]    Past Surgical History:  Procedure Laterality Date   JOINT REPLACEMENT  09/2011   left total knee   JOINT REPLACEMENT  06/2006   right total knee   KNEE CLOSED REDUCTION  12/05/2011   Procedure: CLOSED MANIPULATION KNEE;  Surgeon: FGearlean Alf  Location: [facility];  Service: Orthopedics;  Laterality: Left;   PACEMAKER INSERTION  02/17/2015   [place]   Social History   Tobacco Use   Smoking status: Never   Smokeless tobacco: Never  Substance Use Topics   Alcohol use: No    Alcohol/week: 0.0 standard drinks  Marital Status: Married    ROS  Review of Systems  Cardiovascular:  Negative for chest pain, dyspnea on exertion  and leg swelling.  Gastrointestinal:  Negative for melena.  Objective  Blood pressure 101/68, pulse 81, temperature 97.8 F (36.6 C), temperature source Temporal, resp. rate 17, height _0  (1.88 m), weight 201 lb 12.8 oz (91.5 kg), SpO2 98 %. Body mass index is 25.91 kg/m.  Vitals with BMI 09/19/2021 03/09/2021 09/09/2020  Height _1  _2  _3   Weight 201 lbs 13 oz 215 lbs 6 oz 198 lbs  BMI 25.9 [phone] Systolic [id number] Diastolic 68 69 65  Pulse 81 87 65     Physical Exam Constitutional:      General: He is not in acute distress.    Appearance: He is well-developed.  HENT:     Head: Atraumatic.  Eyes:     Conjunctiva/sclera: Conjunctivae normal.  Neck:     Thyroid: No thyromegaly.     Vascular: No JVD.  Cardiovascular:     Rate and Rhythm: Normal rate and regular rhythm.     Pulses: Intact distal pulses.     Heart sounds: Normal heart sounds. No murmur heard.   No gallop.  Pulmonary:     Effort: Pulmonary effort is normal.     Breath sounds: Normal breath sounds.  Abdominal:     General: Bowel sounds are normal.     Palpations: Abdomen is soft.  Musculoskeletal:        General: Normal range of motion.     Cervical back:  Neck supple.  Skin:    General: Skin is warm and dry.  Neurological:     Mental Status: He is alert.   Radiology: No results found.  Laboratory examination:   Labs 07/26/2021:  A1c 6.5%.  Sodium 137, potassium 4.9, serum glucose 143 mg, BUN 17, creatinine 1.13, EGFR >60 mL.  CMP otherwise normal.  Labs 04/19/2021:  Total cholesterol 109, triglycerides 182, HDL 31, LDL direct 44.  Hb 13.1/HCT 37.5, platelets 170, normal indicis.  Medications   Allergies  Allergen Reactions   Atorvastatin     myalgias   Current Outpatient Medications on File Prior to Visit  Medication Sig Dispense Refill   acetaminophen (TYLENOL) 650 MG CR tablet Take 650 mg by mouth. 2 tabs bid     Calcium Carbonate-Vit D-Min (CALCIUM 1200 PO) Take by  mouth daily.     Cholecalciferol (D3 SUPER STRENGTH) 50 MCG (2000 UT) CAPS Take by mouth daily.     Coenzyme Q-10 200 MG CAPS Take 1 capsule by mouth daily.     empagliflozin (JARDIANCE) 10 MG TABS tablet Take 1 tablet by mouth daily.     flecainide (TAMBOCOR) 100 MG tablet TAKE 1 TABLET BY MOUTH TWICE A DAY 180 tablet 3   L-Lysine HCl 500 MG TABS Take 1 tablet by mouth daily.     levocetirizine (XYZAL) 5 MG tablet Take 1 tablet by mouth daily.     lisinopril (PRINIVIL,ZESTRIL) 20 MG tablet Take 20 mg by mouth at bedtime.     metoprolol tartrate (LOPRESSOR) 100 MG tablet TAKE 1 TABLET BY MOUTH TWICE A DAY 180 tablet 3   omega-3 acid ethyl esters (LOVAZA) 1 g capsule TAKE 1 CAPSULE (1 G TOTAL) BY MOUTH 2 (TWO) TIMES DAILY. 180 capsule 3   PRADAXA 150 MG CAPS capsule TAKE 1 CAPSULE BY MOUTH TWICE A DAY 180 capsule 2   psyllium (METAMUCIL) 28 % packet Take 1 packet by mouth daily.     rosuvastatin (CRESTOR) 10 MG tablet Take 10 mg by mouth daily.     sertraline (ZOLOFT) 50 MG tablet daily.     TURMERIC PO Take 800 mg by mouth daily.     No current facility-administered medications on file prior to visit.     Cardiac Studies:   Exercise treadmill stress test 09/21/2019: Exercise treadmill stress test was performed using Bruce protocol. Patient reached 7 METS, and 67% of age predicted maximum heart rate. Exercise capacity was low. Chest pain not reported. Heart rate and hemodynamic response were normal.  Stress test is non-diagnostic due to low exercise capacity. In addition, it showed atrial paced rhythm, RBBB, and LAFB at 67% age predicted maximum heart rate. No arrhythmia noted.  Consider alternate ischemia testing, if clinically indicated.   PCV MYOCARDIAL PERFUSION WITH LEXISCAN 04/03/2021  Narrative Lexiscan Tetrofosmin Stress Test  04/03/2021: Nondiagnostic ECG stress. AV paced rhythm. Myocardial perfusion is normal. Overall LV systolic function is normal without regional wall  motion abnormalities. Stress LV EF: 61%. No previous exam available for comparison. Low risk.  PCV ECHOCARDIOGRAM COMPLETE 03/27/2021  Narrative Echocardiogram 03/27/2021: Normal LV systolic function with visual EF 60-65%. Left ventricle cavity is normal in size. Normal left ventricular wall thickness. Normal global wall motion. Normal diastolic filling pattern, normal LAP. Endocardial wires noted within the right cardiac chambers. Mild (Grade I) aortic regurgitation. Mild (Grade I) mitral regurgitation. Mild tricuspid regurgitation. Mild pulmonic regurgitation. No prior study for comparison.    Device Clinic: Pacemaker Medtronic Adapta ADDR01 in situ 02/17/2015  Remote pacemaker transmission 08/08/2021: Longevity 3 years. AP 100%, VP 75%. Frequent mode switches, longest 3 days and 28 minutes. EGM = PAF. Normal pacemaker function. AT/AF burden <1%. There were no high ventricular rate episodes. Normal pacemaker function.  EKG:   EKG 09/19/2021: AV paced rhythm.  No further analysis.  EKG 03/09/2021: Atrially paced and ventricularly sensed rhythm at the rate of 64 bpm.  Incomplete right bundle branch block.  Marked T wave abnormality, probably related to paced rhythm and memory affect, cannot exclude inferior and anterolateral ischemia.   No significant change from EKG 09/09/2020: AV paced rhythm at the rate of 74 bpm.   Assessment     ICD-10-CM   1. Paroxysmal atrial fibrillation (HCC)  I48.0 EKG 12-Lead    2. Sick sinus syndrome (HCC)  I49.5     3. Pacemaker Medtronic Adapta ADDR01 in situ 02/17/2015  Z95.0     4. Mixed hyperlipidemia  E78.2      Medications Discontinued During This Encounter  Medication Reason   COVID-19 mRNA vaccine, Moderna, 100 MCG/0.5ML injection Error   metFORMIN (GLUCOPHAGE-XR) 500 MG 24 hr tablet Error    No orders of the defined types were placed in this encounter.   Recommendations:   [name] is a 75 y.o. male Caucasian male PMH of  mild hypertension, hyperlipidemia and diet-controlled diabetes mellitus.  He is intolerant to statins, tolerated only Livalo. Due to sick sinus syndrome and has had pacemaker implanted on 02/17/2015 in [place] when he developed symptomatic bradycardia and heart block. He was found to be in atrial fibrillation 2016 when he presented with fatigue, now on antiarrhythmic therapy with flecainide, anti-coagulated with Pradaxa.  He presents today for 50-monthfollow up visit. Overall he is doing well and remains asymptomatic.  He is presently tolerating flecainide 100 mg twice daily, pacemaker data reveals <1% atrial fibrillation burden.  Pacemaker is functioning normally, blood pressure is well controlled.  I reviewed his labs, triglycerides continue to remain elevated with mild elevation in serum glucose.  We have discussed regarding reduction in his total calorie intake by 10% and see if this would make a difference.  In view of diabetic state, if triglycerides do not improve, we may have to consider either addition of Vascepa but in view of patient being on Pradaxa, probably prefer Trilipix 48 mg daily.  Otherwise he is tolerating all his medications well, EKG reveals AV paced rhythm.  He has not had any dizziness or syncope.  No changes were done today, I will see him back on an annual basis, however he will need continued monitoring of his renal function in view of patient being on flecainide and he will contact me if he has any dizziness or change in his renal function as well.  He closely follows with Dr. FKathryne Erikssonregularly.     JAdrian Prows MD, FWilliam [facility]cc[facility] Facility10/03/2021, 10:39 AM Office: [phone]

## 2021-09-26 ENCOUNTER — Other Ambulatory Visit: Payer: Self-pay

## 2021-09-26 ENCOUNTER — Ambulatory Visit: Payer: Medicare PPO | Admitting: Cardiology

## 2021-09-26 DIAGNOSIS — Z950 Presence of cardiac pacemaker: Secondary | ICD-10-CM

## 2021-09-26 DIAGNOSIS — I495 Sick sinus syndrome: Secondary | ICD-10-CM

## 2021-09-26 DIAGNOSIS — Z45018 Encounter for adjustment and management of other part of cardiac pacemaker: Secondary | ICD-10-CM

## 2021-09-26 NOTE — Progress Notes (Signed)
Chief Complaint  Patient presents with   Pacemaker Check    Scheduled  In office pacemaker check 09/26/21  Single (S)/Dual (D)/BV: D. Presenting APVP. Pacemaker dependant:  Yes. Underlying Sinus arrest. AP 100%, VP 76%.   AMS Episodes 1 since last interogation Aug 2022.  AT/AF burden 1%% . Longest 4 hours. Latest 09/07/21. HVR 0.   Longevity 3 Years. Magnet rate: >85%. Lead measurements: Stable. Histogram: Low (L)/normal (N)/high (H)  Normal. Patient activity Good.   Observations: Normal function. Changes: None.

## 2021-10-02 ENCOUNTER — Telehealth: Payer: Self-pay

## 2021-10-02 NOTE — Telephone Encounter (Signed)
NOTES SCANNED TO REFERRAL RJ 

## 2021-10-19 ENCOUNTER — Other Ambulatory Visit: Payer: Self-pay | Admitting: Cardiology

## 2021-10-20 ENCOUNTER — Other Ambulatory Visit: Payer: Self-pay

## 2021-10-20 ENCOUNTER — Telehealth: Payer: Self-pay | Admitting: Cardiology

## 2021-10-20 MED ORDER — DABIGATRAN ETEXILATE MESYLATE 150 MG PO CAPS: 150.0000 mg | ORAL_CAPSULE | Freq: Two times a day (BID) | ORAL | 2 refills | Status: DC

## 2021-10-20 NOTE — Telephone Encounter (Signed)
Patient waiting on refill for pradaxa. Use [facility] in [place].

## 2021-10-20 NOTE — Telephone Encounter (Signed)
Ok done

## 2021-11-15 ENCOUNTER — Encounter: Payer: Self-pay | Admitting: Cardiology

## 2021-11-15 ENCOUNTER — Other Ambulatory Visit: Payer: Self-pay

## 2021-11-15 ENCOUNTER — Encounter: Payer: Medicare PPO | Admitting: Cardiology

## 2021-11-15 NOTE — Progress Notes (Signed)
This encounter was created in error - please disregard.

## 2021-12-14 ENCOUNTER — Telehealth: Payer: Self-pay

## 2021-12-14 ENCOUNTER — Ambulatory Visit: Payer: Medicare PPO | Admitting: Cardiology

## 2021-12-14 ENCOUNTER — Other Ambulatory Visit: Payer: Self-pay | Admitting: Cardiology

## 2021-12-14 ENCOUNTER — Encounter: Payer: Self-pay | Admitting: Cardiology

## 2021-12-14 ENCOUNTER — Other Ambulatory Visit: Payer: Self-pay

## 2021-12-14 VITALS — BP 100/68 | HR 85 | Ht 74.0 in | Wt 200.0 lb

## 2021-12-14 DIAGNOSIS — I480 Paroxysmal atrial fibrillation: Secondary | ICD-10-CM | POA: Diagnosis not present

## 2021-12-14 DIAGNOSIS — Z950 Presence of cardiac pacemaker: Secondary | ICD-10-CM

## 2021-12-14 DIAGNOSIS — Z45018 Encounter for adjustment and management of other part of cardiac pacemaker: Secondary | ICD-10-CM

## 2021-12-14 DIAGNOSIS — I495 Sick sinus syndrome: Secondary | ICD-10-CM

## 2021-12-14 NOTE — Patient Instructions (Signed)
Medication Instructions:  Your physician recommends that you continue on your current medications as directed. Please refer to the Current Medication list given to you today. *If you need a refill on your cardiac medications before your next appointment, please call your pharmacy*  Lab Work: None ordered. If you have labs (blood work) drawn today and your tests are completely normal, you will receive your results only by: MyChart Message (if you have MyChart) OR A paper copy in the mail If you have any lab test that is abnormal or we need to change your treatment, we will call you to review the results.  Testing/Procedures: None ordered.  Follow-Up: At [facility], you and your health needs are our priority.  As part of our continuing mission to provide you with exceptional heart care, we have created designated Provider Care Teams.  These Care Teams include your primary Cardiologist (physician) and Advanced Practice Providers (APPs -  Physician Assistants and Nurse Practitioners) who all work together to provide you with the care you need, when you need it.  Your next appointment:   Your physician wants you to follow-up in: 6 months with Lanier Prude, MD   Remote monitoring is used to monitor your Pacemaker from home.   We will get your remotes established with our device clinic. [phone]

## 2021-12-14 NOTE — Telephone Encounter (Signed)
Cancel 09/21/2022 9:00 AM appointment and state transferred to [facility] please

## 2021-12-14 NOTE — Progress Notes (Signed)
Electrophysiology Office Note:    Date:  12/14/2021   ID:  [name], DOB Jan 31, 1946, MRN [id number]  PCP:  [name], MD  [facility] Cardiologist:  Armanda Magic, MD  [facility] Electrophysiologist:  Lanier Prude, MD   Referring MD: [name], MD   Chief Complaint: AF and PPM  History of Present Illness:    [name] is a 75 y.o. male who presents for an evaluation of AF and PPM at the request of Dr Andrey Campanile. Their medical history includes HTN, HLD, DM and SSS s/p PPM. He takes flecainide and Pradaxa for his AF.  He previously saw Dr Graciela Husbands for his AF--given presence of AF and AFL, a flutter ablation was not recommended.  The patient presents today to establish care with our practice.  He is transitioning his care from [place] [facility] to heartcare.  He has been doing well and does not have a specific complaint today.  He and his wife are doing intermittent fasting which she is pleased with.  His primary care manages his diabetes.  His last A1c was less than 7.    Past Medical History:  Diagnosis Date   Arthritis    Diabetes mellitus    exercise and diet controlled   Encounter for care of pacemaker    Hypertension    Paroxysmal atrial fibrillation [facility])    Sick sinus syndrome [facility])     Past Surgical History:  Procedure Laterality Date   JOINT REPLACEMENT  09/2011   left total knee   JOINT REPLACEMENT  06/2006   right total knee   KNEE CLOSED REDUCTION  12/05/2011   Procedure: CLOSED MANIPULATION KNEE;  Surgeon: Loanne Drilling;  Location: [facility];  Service: Orthopedics;  Laterality: Left;   PACEMAKER INSERTION  02/17/2015   MDT Adapta implanted by Dr Ambrose Pancoast in [place] for sick sinus syndrome    Current Medications: Current Meds  Medication Sig   acetaminophen (TYLENOL) 650 MG CR tablet Take 650 mg by mouth. 2 tabs bid   Calcium Carbonate-Vit D-Min (CALCIUM 1200 PO) Take by mouth daily.   Cholecalciferol (D3 SUPER STRENGTH)  50 MCG (2000 UT) CAPS Take by mouth daily.   Coenzyme Q-10 200 MG CAPS Take 1 capsule by mouth daily.   dabigatran (PRADAXA) 150 MG CAPS capsule Take 1 capsule (150 mg total) by mouth 2 (two) times daily.   empagliflozin (JARDIANCE) 10 MG TABS tablet Take 1 tablet by mouth daily.   flecainide (TAMBOCOR) 100 MG tablet TAKE 1 TABLET BY MOUTH TWICE A DAY   L-Lysine HCl 500 MG TABS Take 1 tablet by mouth daily.   levocetirizine (XYZAL) 5 MG tablet Take 1 tablet by mouth daily.   lisinopril (PRINIVIL,ZESTRIL) 20 MG tablet Take 20 mg by mouth at bedtime.   metoprolol tartrate (LOPRESSOR) 100 MG tablet TAKE 1 TABLET BY MOUTH TWICE A DAY   omega-3 acid ethyl esters (LOVAZA) 1 g capsule TAKE 1 CAPSULE (1 G TOTAL) BY MOUTH 2 (TWO) TIMES DAILY.   PRADAXA 150 MG CAPS capsule TAKE 1 CAPSULE BY MOUTH TWICE A DAY   psyllium (METAMUCIL) 28 % packet Take 1 packet by mouth daily.   rosuvastatin (CRESTOR) 10 MG tablet Take 10 mg by mouth daily.   sertraline (ZOLOFT) 50 MG tablet daily.   TURMERIC PO Take 800 mg by mouth daily.     Allergies:   Atorvastatin   Social History   Socioeconomic History   Marital status: Married  Spouse name: Not on file   Number of children: 3   Years of education: Not on file   Highest education level: Not on file  Occupational History   Not on file  Tobacco Use   Smoking status: Never   Smokeless tobacco: Never  Vaping Use   Vaping Use: Never used  Substance and Sexual Activity   Alcohol use: No    Alcohol/week: 0.0 standard drinks   Drug use: No   Sexual activity: Not on file  Other Topics Concern   Not on file  Social History Narrative   L[place] in [place].  Spends 3-4 months per year in [place] in an [place].   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   Social Determinants of Health   Financial Resource Strain: Not on file  Food Insecurity: Not on file   Transportation Needs: Not on file  Physical Activity: Not on file  Stress: Not on file  Social Connections: Not on file     Family History: The patient's family history includes Heart attack (age of onset: 7) in his father; Heart disease in his mother; Hypertension in his mother.  ROS:   Please see the history of present illness.    All other systems reviewed and are negative.  EKGs/Labs/Other Studies Reviewed:    The following studies were reviewed today:  December 14, 2021 device interrogation personally reviewed Battery longevity 2.5 years Lead parameter stable on trends Mode switch percent 0.1% Dependent in the a and V.  EKG:  The ekg ordered today demonstrates AV sequential pacing   Recent Labs: No results found for requested labs within last 8760 hours.  Recent Lipid Panel    Component Value Date/Time   CHOL 147 03/02/2019 0858   TRIG 201 (H) 03/02/2019 0858   HDL 30 (L) 03/02/2019 0858   LDLCALC 77 03/02/2019 0858    Physical Exam:    VS:  BP 100/68    Pulse 85    Ht 6\' 2"  (1.88 m)    Wt 200 lb (90.7 kg)    SpO2 93%    BMI 25.68 kg/m     Wt Readings from Last 3 Encounters:  12/14/21 200 lb (90.7 kg)  11/15/21 203 lb (92.1 kg)  09/19/21 201 lb 12.8 oz (91.5 kg)     GEN:  Well nourished, well developed in no acute distress HEENT: Normal NECK: No JVD; No carotid bruits LYMPHATICS: No lymphadenopathy CARDIAC: RRR, no murmurs, rubs, gallops.  Pacemaker pocket well-healed.  No pain or swelling. RESPIRATORY:  Clear to auscultation without rales, wheezing or rhonchi  ABDOMEN: Soft, non-tender, non-distended MUSCULOSKELETAL:  No edema; No deformity  SKIN: Warm and dry NEUROLOGIC:  Alert and oriented x 3 PSYCHIATRIC:  Normal affect       ASSESSMENT:    1. Paroxysmal atrial fibrillation (HCC)   2. Sick sinus syndrome (HCC)   3. Pacemaker Medtronic Adapta ADDR01 in situ 02/17/2015   4. Encounter for care of pacemaker    PLAN:    In order of  problems listed above:   #Paroxysmal atrial fibrillation and flutter No evidence of A. fib on his device interrogation today.  Less than 0.1% burden.  On Pradaxa for stroke prophylaxis.  I would recommend continuing that in addition to his flecainide and metoprolol.  #Sick sinus syndrome post permanent pacemaker Device functioning appropriately.  We will establish him in our device clinic for  routine remote monitoring.   We will plan to see him back in 6 months or sooner as needed.      Medication Adjustments/Labs and Tests Ordered: Current medicines are reviewed at length with the patient today.  Concerns regarding medicines are outlined above.  No orders of the defined types were placed in this encounter.  No orders of the defined types were placed in this encounter.    Signed, Rossie Muskrat. Lalla Brothers, MD, [facility] 12/14/2021 12:41 PM    Electrophysiology [facility]

## 2021-12-21 ENCOUNTER — Telehealth: Payer: Self-pay

## 2021-12-21 NOTE — Telephone Encounter (Signed)
I left a message for [facility] to release the patient. I told her she can speak with me or Shayna.

## 2021-12-22 NOTE — Telephone Encounter (Signed)
Pt is now in our [facility] system.

## 2022-03-16 ENCOUNTER — Ambulatory Visit (INDEPENDENT_AMBULATORY_CARE_PROVIDER_SITE_OTHER): Payer: Medicare PPO

## 2022-03-16 DIAGNOSIS — I495 Sick sinus syndrome: Secondary | ICD-10-CM | POA: Diagnosis not present

## 2022-03-16 LAB — CUP PACEART REMOTE DEVICE CHECK
Battery Impedance: 2040 Ohm
Battery Remaining Longevity: 26 mo
Battery Voltage: 2.74 V
Brady Statistic AP VP Percent: 98 %
Brady Statistic AP VS Percent: 2 %
Brady Statistic AS VP Percent: 0 %
Brady Statistic AS VS Percent: 0 %
Date Time Interrogation Session: 20230331120007
Implantable Lead Implant Date: 20160303
Implantable Lead Implant Date: 20160303
Implantable Lead Location: 753859
Implantable Lead Location: 753860
Implantable Lead Model: 5076
Implantable Lead Model: 5092
Implantable Pulse Generator Implant Date: 20160303
Lead Channel Impedance Value: 531 Ohm
Lead Channel Impedance Value: 580 Ohm
Lead Channel Pacing Threshold Amplitude: 0.5 V
Lead Channel Pacing Threshold Amplitude: 0.75 V
Lead Channel Pacing Threshold Pulse Width: 0.4 ms
Lead Channel Pacing Threshold Pulse Width: 0.4 ms
Lead Channel Setting Pacing Amplitude: 2 V
Lead Channel Setting Pacing Amplitude: 2.5 V
Lead Channel Setting Pacing Pulse Width: 0.4 ms
Lead Channel Setting Sensing Sensitivity: 2 mV

## 2022-03-28 NOTE — Progress Notes (Signed)
Remote pacemaker transmission.   

## 2022-06-14 ENCOUNTER — Ambulatory Visit: Payer: Medicare PPO | Admitting: Cardiology

## 2022-06-14 ENCOUNTER — Encounter: Payer: Self-pay | Admitting: Cardiology

## 2022-06-14 VITALS — BP 96/64 | HR 74 | Ht 73.5 in | Wt 198.0 lb

## 2022-06-14 DIAGNOSIS — I480 Paroxysmal atrial fibrillation: Secondary | ICD-10-CM | POA: Diagnosis not present

## 2022-06-14 DIAGNOSIS — I495 Sick sinus syndrome: Secondary | ICD-10-CM | POA: Diagnosis not present

## 2022-06-14 DIAGNOSIS — Z45018 Encounter for adjustment and management of other part of cardiac pacemaker: Secondary | ICD-10-CM | POA: Diagnosis not present

## 2022-06-14 NOTE — Patient Instructions (Signed)

## 2022-06-14 NOTE — Progress Notes (Signed)
Electrophysiology Office Follow up Visit Note:    Date:  06/14/2022   ID:  [name], DOB July 25, 1946, MRN [id number]  PCP:  [name], MD  [facility] Cardiologist:  Armanda Magic, MD  [facility] Electrophysiologist:  Lanier Prude, MD    Interval History:    [name] is a 76 y.o. male who presents for a follow up visit of their PPM. They were last seen in clinic 12/14/2021. At that visit there was no evidence of A. fib on his device interrogation.  Less than 0.1% burden.  On Pradaxa for stroke prophylaxis.  Recommended continuing that in addition to his flecainide and metoprolol.  Today, he denies any known issues with his PPM. Typically he is unaware of his arrhythmic episodes when or if they occur.  They deny any palpitations, chest pain, shortness of breath, or peripheral edema. No lightheadedness, headaches, syncope, orthopnea, or PND.      Past Medical History:  Diagnosis Date   Arthritis    Diabetes mellitus    exercise and diet controlled   Encounter for care of pacemaker    Hypertension    Paroxysmal atrial fibrillation [facility])    Sick sinus syndrome [facility])     Past Surgical History:  Procedure Laterality Date   JOINT REPLACEMENT  09/2011   left total knee   JOINT REPLACEMENT  06/2006   right total knee   KNEE CLOSED REDUCTION  12/05/2011   Procedure: CLOSED MANIPULATION KNEE;  Surgeon: Loanne Drilling;  Location: [facility];  Service: Orthopedics;  Laterality: Left;   PACEMAKER INSERTION  02/17/2015   MDT Adapta implanted by Dr Ambrose Pancoast in [place] for sick sinus syndrome    Current Medications: Current Meds  Medication Sig   acetaminophen (TYLENOL) 650 MG CR tablet Take 650 mg by mouth. 2 tabs bid   Calcium Carbonate-Vit D-Min (CALCIUM 1200 PO) Take by mouth daily.   Cholecalciferol (D3 SUPER STRENGTH) 50 MCG (2000 UT) CAPS Take by mouth daily.   Coenzyme Q-10 200 MG CAPS Take 1 capsule by mouth daily.   empagliflozin (JARDIANCE) 25  MG TABS tablet Take 25 mg by mouth daily.   flecainide (TAMBOCOR) 100 MG tablet TAKE 1 TABLET BY MOUTH TWICE A DAY   L-Lysine HCl 500 MG TABS Take 1 tablet by mouth daily.   levocetirizine (XYZAL) 5 MG tablet Take 1 tablet by mouth daily.   lisinopril (PRINIVIL,ZESTRIL) 20 MG tablet Take 20 mg by mouth at bedtime.   metoprolol tartrate (LOPRESSOR) 100 MG tablet TAKE 1 TABLET BY MOUTH TWICE A DAY   omega-3 acid ethyl esters (LOVAZA) 1 g capsule TAKE 1 CAPSULE (1 G TOTAL) BY MOUTH 2 (TWO) TIMES DAILY.   PRADAXA 150 MG CAPS capsule TAKE 1 CAPSULE BY MOUTH TWICE A DAY   psyllium (METAMUCIL) 28 % packet Take 1 packet by mouth daily.   rosuvastatin (CRESTOR) 10 MG tablet Take 10 mg by mouth daily.   sertraline (ZOLOFT) 50 MG tablet daily.   TURMERIC PO Take 800 mg by mouth daily.     Allergies:   Atorvastatin   Social History   Socioeconomic History   Marital status: Married    Spouse name: Not on file   Number of children: 3   Years of education: Not on file   Highest education level: Not on file  Occupational History   Not on file  Tobacco Use   Smoking status: Never   Smokeless tobacco: Never  Vaping Use  Vaping Use: Never used  Substance and Sexual Activity   Alcohol use: No    Alcohol/week: 0.0 standard drinks of alcohol   Drug use: No   Sexual activity: Not on file  Other Topics Concern   Not on file  Social History Narrative   Lives in [place].  Spends 3-4 months per year in [place] in an [place].   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   Social Determinants of Health   Financial Resource Strain: Not on file  Food Insecurity: Not on file  Transportation Needs: Not on file  Physical Activity: Not on file  Stress: Not on file  Social Connections: Not on file     Family History: The patient's family history includes Heart attack (age of onset: 37) in his father;  Heart disease in his mother; Hypertension in his mother.  ROS:   Please see the history of present illness.    All other systems reviewed and are negative.  EKGs/Labs/Other Studies Reviewed:    The following studies were reviewed today:  06/14/2022  In clinic device interrogation personally reviewed: Battery longevity 25 months Less than 0.1% mode switch Longest episode of AHR on March 15 lasting 8 minutes 30 seconds Lead parameters stable Dependent in the atrium and ventricle  Lexiscan Tetrofosmin Stress Test  04/03/2021: Nondiagnostic ECG stress. AV paced rhythm.  Myocardial perfusion is normal. Overall LV systolic function is normal without regional wall motion abnormalities. Stress LV EF: 61%.  No previous exam available for comparison. Low risk.  03/27/2021  Echocardiogram: Normal LV systolic function with visual EF 60-65%. Left ventricle cavity  is normal in size. Normal left ventricular wall thickness. Normal global  wall motion. Normal diastolic filling pattern, normal LAP.  Endocardial wires noted within the right cardiac chambers.  Mild (Grade I) aortic regurgitation.  Mild (Grade I) mitral regurgitation.  Mild tricuspid regurgitation.  Mild pulmonic regurgitation.  No prior study for comparison.   EKG:  EKG is personally reviewed.  06/14/2022 - EKG shows AV sequential pacing.  Unchanged from prior   Recent Labs: No results found for requested labs within last 365 days.   Recent Lipid Panel    Component Value Date/Time   CHOL 147 03/02/2019 0858   TRIG 201 (H) 03/02/2019 0858   HDL 30 (L) 03/02/2019 0858   LDLCALC 77 03/02/2019 0858    Physical Exam:    VS:  BP 96/64 (BP Location: Right Arm, Patient Position: Sitting, Cuff Size: Normal)   Pulse 74   Ht 6' 1.5" (1.867 m)   Wt 198 lb (89.8 kg)   BMI 25.77 kg/m     Wt Readings from Last 3 Encounters:  06/14/22 198 lb (89.8 kg)  12/14/21 200 lb (90.7 kg)  11/15/21 203 lb (92.1 kg)     GEN: Well  nourished, well developed in no acute distress HEENT: Normal NECK: No JVD; No carotid bruits LYMPHATICS: No lymphadenopathy CARDIAC: RRR, no murmurs, rubs, gallops; Device pocket well healed. RESPIRATORY:  Clear to auscultation without rales, wheezing or rhonchi  ABDOMEN: Soft, non-tender, non-distended MUSCULOSKELETAL:  No edema; No deformity  SKIN: Warm and dry NEUROLOGIC:  Alert and oriented x 3 PSYCHIATRIC:  Normal affect        ASSESSMENT:    1. Paroxysmal atrial fibrillation (HCC)   2. Sick sinus syndrome (HCC)   3. Encounter for care of pacemaker    PLAN:  In order of problems listed above:  #Paroxysmal atrial fibrillation Maintaining rhythm for the most part on flecainide and metoprolol.  On Pradaxa for stroke prophylaxis.  EKG today okay for continued flecainide use.  #Sick sinus syndrome #Pacemaker in situ Device functioning appropriately Continue remote monitoring  Follow-up in 1 year sooner as needed   Medication Adjustments/Labs and Tests Ordered: Current medicines are reviewed at length with the patient today.  Concerns regarding medicines are outlined above.  No orders of the defined types were placed in this encounter.  No orders of the defined types were placed in this encounter.   I,Mathew Stumpf,acting as a Neurosurgeon for Lanier Prude, MD.,have documented all relevant documentation on the behalf of Lanier Prude, MD,as directed by  Lanier Prude, MD while in the presence of Lanier Prude, MD.  I, Lanier Prude, MD, have reviewed all documentation for this visit. The documentation on 06/14/22 for the exam, diagnosis, procedures, and orders are all accurate and complete.   Signed, Steffanie Dunn, MD, [facility], [facility] 06/14/2022 10:56 AM    Electrophysiology [facility]

## 2022-07-03 ENCOUNTER — Other Ambulatory Visit: Payer: Self-pay

## 2022-07-03 MED ORDER — METOPROLOL TARTRATE 100 MG PO TABS: 100.0000 mg | ORAL_TABLET | Freq: Two times a day (BID) | ORAL | 3 refills | Status: DC

## 2022-07-04 ENCOUNTER — Other Ambulatory Visit: Payer: Self-pay

## 2022-07-05 ENCOUNTER — Encounter: Payer: Self-pay | Admitting: Cardiology

## 2022-07-05 DIAGNOSIS — E782 Mixed hyperlipidemia: Secondary | ICD-10-CM

## 2022-07-06 ENCOUNTER — Ambulatory Visit (INDEPENDENT_AMBULATORY_CARE_PROVIDER_SITE_OTHER): Payer: Medicare PPO

## 2022-07-06 ENCOUNTER — Other Ambulatory Visit: Payer: Self-pay | Admitting: *Deleted

## 2022-07-06 ENCOUNTER — Other Ambulatory Visit: Payer: Self-pay

## 2022-07-06 DIAGNOSIS — I495 Sick sinus syndrome: Secondary | ICD-10-CM

## 2022-07-06 DIAGNOSIS — I480 Paroxysmal atrial fibrillation: Secondary | ICD-10-CM

## 2022-07-06 LAB — CUP PACEART REMOTE DEVICE CHECK
Battery Impedance: 2264 Ohm
Battery Remaining Longevity: 24 mo
Battery Voltage: 2.72 V
Brady Statistic AP VP Percent: 92 %
Brady Statistic AP VS Percent: 8 %
Brady Statistic AS VP Percent: 0 %
Brady Statistic AS VS Percent: 0 %
Date Time Interrogation Session: 20230720151328
Implantable Lead Implant Date: 20160303
Implantable Lead Implant Date: 20160303
Implantable Lead Location: 753859
Implantable Lead Location: 753860
Implantable Lead Model: 5076
Implantable Lead Model: 5092
Implantable Pulse Generator Implant Date: 20160303
Lead Channel Impedance Value: 535 Ohm
Lead Channel Impedance Value: 606 Ohm
Lead Channel Pacing Threshold Amplitude: 0.625 V
Lead Channel Pacing Threshold Amplitude: 0.75 V
Lead Channel Pacing Threshold Pulse Width: 0.4 ms
Lead Channel Pacing Threshold Pulse Width: 0.4 ms
Lead Channel Setting Pacing Amplitude: 2 V
Lead Channel Setting Pacing Amplitude: 2.5 V
Lead Channel Setting Pacing Pulse Width: 0.4 ms
Lead Channel Setting Sensing Sensitivity: 2 mV

## 2022-07-06 MED ORDER — OMEGA-3-ACID ETHYL ESTERS 1 G PO CAPS: 1.0000 g | ORAL_CAPSULE | Freq: Two times a day (BID) | ORAL | 3 refills | Status: DC

## 2022-07-06 MED ORDER — EMPAGLIFLOZIN 25 MG PO TABS: 25.0000 mg | ORAL_TABLET | Freq: Every day | ORAL | 3 refills | Status: DC

## 2022-07-06 MED ORDER — FLECAINIDE ACETATE 100 MG PO TABS: 100.0000 mg | ORAL_TABLET | Freq: Two times a day (BID) | ORAL | 3 refills | Status: DC

## 2022-07-06 MED ORDER — DABIGATRAN ETEXILATE MESYLATE 150 MG PO CAPS: 150.0000 mg | ORAL_CAPSULE | Freq: Two times a day (BID) | ORAL | 2 refills | Status: DC

## 2022-07-06 NOTE — Addendum Note (Signed)
Addended by: Sigurd Sos on: 07/06/2022 10:59 AM   Modules accepted: Orders

## 2022-07-06 NOTE — Addendum Note (Signed)
Addended by: Margaret Pyle D on: 07/06/2022 10:13 AM   Modules accepted: Orders

## 2022-07-06 NOTE — Telephone Encounter (Signed)
Prescription refill request for Xarelto received.  Indication:Afib Last office visit:6/23 Weight:89.8 kg Age:76 Scr:1.3 CrCl:61.4 ml/min  Prescription refilled

## 2022-07-15 ENCOUNTER — Other Ambulatory Visit: Payer: Self-pay | Admitting: Cardiology

## 2022-07-20 NOTE — Progress Notes (Signed)
Remote pacemaker transmission.   

## 2022-08-23 ENCOUNTER — Other Ambulatory Visit: Payer: Self-pay | Admitting: Cardiology

## 2022-08-23 DIAGNOSIS — E782 Mixed hyperlipidemia: Secondary | ICD-10-CM

## 2022-08-23 DIAGNOSIS — I480 Paroxysmal atrial fibrillation: Secondary | ICD-10-CM

## 2022-09-20 ENCOUNTER — Ambulatory Visit: Payer: Medicare PPO | Admitting: Cardiology

## 2022-09-21 ENCOUNTER — Ambulatory Visit: Payer: Medicare PPO | Admitting: Cardiology

## 2022-10-05 ENCOUNTER — Ambulatory Visit (INDEPENDENT_AMBULATORY_CARE_PROVIDER_SITE_OTHER): Payer: Medicare PPO

## 2022-10-05 DIAGNOSIS — I495 Sick sinus syndrome: Secondary | ICD-10-CM | POA: Diagnosis not present

## 2022-10-10 LAB — CUP PACEART REMOTE DEVICE CHECK
Battery Impedance: 2294 Ohm
Battery Remaining Longevity: 24 mo
Battery Voltage: 2.73 V
Brady Statistic AP VP Percent: 93 %
Brady Statistic AP VS Percent: 7 %
Brady Statistic AS VP Percent: 0 %
Brady Statistic AS VS Percent: 0 %
Date Time Interrogation Session: 20231023144224
Implantable Lead Connection Status: 753985
Implantable Lead Connection Status: 753985
Implantable Lead Implant Date: 20160303
Implantable Lead Implant Date: 20160303
Implantable Lead Location: 753859
Implantable Lead Location: 753860
Implantable Lead Model: 5076
Implantable Lead Model: 5092
Implantable Pulse Generator Implant Date: 20160303
Lead Channel Impedance Value: 563 Ohm
Lead Channel Impedance Value: 625 Ohm
Lead Channel Pacing Threshold Amplitude: 0.625 V
Lead Channel Pacing Threshold Amplitude: 0.75 V
Lead Channel Pacing Threshold Pulse Width: 0.4 ms
Lead Channel Pacing Threshold Pulse Width: 0.4 ms
Lead Channel Setting Pacing Amplitude: 2 V
Lead Channel Setting Pacing Amplitude: 2.5 V
Lead Channel Setting Pacing Pulse Width: 0.4 ms
Lead Channel Setting Sensing Sensitivity: 2 mV
Zone Setting Status: 755011
Zone Setting Status: 755011

## 2022-10-11 NOTE — Progress Notes (Signed)
Remote pacemaker transmission.   

## 2022-10-22 ENCOUNTER — Other Ambulatory Visit (HOSPITAL_BASED_OUTPATIENT_CLINIC_OR_DEPARTMENT_OTHER): Payer: Self-pay

## 2022-10-22 MED ORDER — COMIRNATY 30 MCG/0.3ML IM SUSY
PREFILLED_SYRINGE | INTRAMUSCULAR | 0 refills | Status: DC
  Filled 2022-10-22: qty 0.3, 1d supply, fill #0

## 2022-12-24 ENCOUNTER — Other Ambulatory Visit (HOSPITAL_BASED_OUTPATIENT_CLINIC_OR_DEPARTMENT_OTHER): Payer: Self-pay

## 2022-12-24 MED ORDER — TETANUS-DIPHTH-ACELL PERTUSSIS 5-2.5-18.5 LF-MCG/0.5 IM SUSY
PREFILLED_SYRINGE | INTRAMUSCULAR | 0 refills | Status: DC
  Filled 2022-12-24: qty 0.5, 1d supply, fill #0

## 2023-01-04 ENCOUNTER — Ambulatory Visit: Payer: Medicare PPO | Attending: Cardiology

## 2023-01-07 ENCOUNTER — Ambulatory Visit: Payer: Medicare PPO | Attending: Cardiology

## 2023-01-08 LAB — CUP PACEART REMOTE DEVICE CHECK
Battery Impedance: 2294 Ohm
Battery Remaining Longevity: 24 mo
Battery Voltage: 2.73 V
Brady Statistic AP VP Percent: 91 %
Brady Statistic AP VS Percent: 9 %
Brady Statistic AS VP Percent: 0 %
Brady Statistic AS VS Percent: 0 %
Date Time Interrogation Session: 20240122175002
Implantable Lead Connection Status: 753985
Implantable Lead Connection Status: 753985
Implantable Lead Implant Date: 20160303
Implantable Lead Implant Date: 20160303
Implantable Lead Location: 753859
Implantable Lead Location: 753860
Implantable Lead Model: 5076
Implantable Lead Model: 5092
Implantable Pulse Generator Implant Date: 20160303
Lead Channel Impedance Value: 528 Ohm
Lead Channel Impedance Value: 620 Ohm
Lead Channel Pacing Threshold Amplitude: 0.625 V
Lead Channel Pacing Threshold Amplitude: 0.75 V
Lead Channel Pacing Threshold Pulse Width: 0.4 ms
Lead Channel Pacing Threshold Pulse Width: 0.4 ms
Lead Channel Setting Pacing Amplitude: 2 V
Lead Channel Setting Pacing Amplitude: 2.5 V
Lead Channel Setting Pacing Pulse Width: 0.4 ms
Lead Channel Setting Sensing Sensitivity: 2.8 mV
Zone Setting Status: 755011
Zone Setting Status: 755011

## 2023-02-13 ENCOUNTER — Other Ambulatory Visit: Payer: Self-pay | Admitting: Cardiology

## 2023-02-13 DIAGNOSIS — I480 Paroxysmal atrial fibrillation: Secondary | ICD-10-CM

## 2023-02-13 NOTE — Telephone Encounter (Signed)
Prescription refill request for Pradaxa received.  Indication: Afib  Last office visit: 06/14/22 Quentin Ore)  Weight: 89.8kg Age: 77 Scr: 1.25 (11/06/22)  CrCl: 63.48m/min  Appropriate dose. Refill sent.

## 2023-02-13 NOTE — Telephone Encounter (Signed)
Pt's pharmacy is requesting a refill on Pradaxa, a blood thinner and the coumadin department is supposed to refill this medication. Please address

## 2023-04-04 ENCOUNTER — Encounter: Payer: Self-pay | Admitting: Cardiology

## 2023-04-05 ENCOUNTER — Ambulatory Visit: Payer: Medicare PPO

## 2023-04-08 ENCOUNTER — Ambulatory Visit (INDEPENDENT_AMBULATORY_CARE_PROVIDER_SITE_OTHER): Payer: Medicare PPO

## 2023-04-08 DIAGNOSIS — I495 Sick sinus syndrome: Secondary | ICD-10-CM

## 2023-04-09 LAB — CUP PACEART REMOTE DEVICE CHECK
Battery Impedance: 2646 Ohm
Battery Remaining Longevity: 21 mo
Battery Voltage: 2.73 V
Brady Statistic AP VP Percent: 87 %
Brady Statistic AP VS Percent: 13 %
Brady Statistic AS VP Percent: 0 %
Brady Statistic AS VS Percent: 0 %
Date Time Interrogation Session: 20240422214401
Implantable Lead Connection Status: 753985
Implantable Lead Connection Status: 753985
Implantable Lead Implant Date: 20160303
Implantable Lead Implant Date: 20160303
Implantable Lead Location: 753859
Implantable Lead Location: 753860
Implantable Lead Model: 5076
Implantable Lead Model: 5092
Implantable Pulse Generator Implant Date: 20160303
Lead Channel Impedance Value: 534 Ohm
Lead Channel Impedance Value: 600 Ohm
Lead Channel Pacing Threshold Amplitude: 0.625 V
Lead Channel Pacing Threshold Amplitude: 0.75 V
Lead Channel Pacing Threshold Pulse Width: 0.4 ms
Lead Channel Pacing Threshold Pulse Width: 0.4 ms
Lead Channel Setting Pacing Amplitude: 2 V
Lead Channel Setting Pacing Amplitude: 2.5 V
Lead Channel Setting Pacing Pulse Width: 0.4 ms
Lead Channel Setting Sensing Sensitivity: 2 mV
Zone Setting Status: 755011
Zone Setting Status: 755011

## 2023-05-15 NOTE — Progress Notes (Signed)
Remote pacemaker transmission.   

## 2023-06-12 ENCOUNTER — Encounter: Payer: Medicare PPO | Admitting: Cardiology

## 2023-06-26 NOTE — Progress Notes (Signed)
  Electrophysiology Office Follow up Visit Note:    Date:  06/27/2023   ID:  Freida Busman Clendenning, DOB [birth year], MRN [id number]  PCP:  [name], MD  [facility] Cardiologist:  Armanda Magic, MD  [facility] Electrophysiologist:  Lanier Prude, MD    Interval History:    [name] is a 77 y.o. male who presents for a follow up visit.   Last seen June 14, 2022 for paroxysmal atrial fibrillation, sick sinus syndrome, permanent pacemaker and flecainide use.  Since I last saw the patient, he has been doing well. He denies any significant issues with his device or otherwise. He remains compliant with flecainide and Pradaxa. No bleeding issues.  He denies any palpitations, chest pain, shortness of breath, peripheral edema, lightheadedness, headaches, syncope, orthopnea, or PND.     Past medical, surgical, social and family history were reviewed.  ROS:  Please see the history of present illness. All other systems reviewed and are negative.  EKGs/Labs/Other Studies Reviewed:    The following studies were reviewed today:  June 27, 2023 in clinic device interrogation personally reviewed Battery longevity 4 to 32 months Lead parameter stable Atrial and ventricular dependent Reprogrammed to DDD from AAIR today Atrial high rate episodes reviewed and appear to be in atrial tach Total burden of atrial high rate is less than 0.1%  EKG Interpretation Date/Time:  Thursday June 27 2023 10:31:07 EDT Ventricular Rate:  81 PR Interval:  210 QRS Duration:  164 QT Interval:  430 QTC Calculation: 499 R Axis:   -79  Text Interpretation: AV dual-paced rhythm with prolonged AV conduction Confirmed by Steffanie Dunn [phone]) on 06/27/2023 10:41:08 AM    Physical Exam:    VS:  BP 116/78   Pulse 81   Ht 6' 1.5" (1.867 m)   Wt 200 lb (90.7 kg)   SpO2 95%   BMI 26.03 kg/m     Wt Readings from Last 3 Encounters:  06/27/23 200 lb (90.7 kg)  06/14/22 198 lb (89.8 kg)  12/14/21  200 lb (90.7 kg)     GEN:  Well nourished, well developed in no acute distress CARDIAC: RRR, no murmurs, rubs, gallops.  Pacemaker pocket well-healed RESPIRATORY:  Clear to auscultation without rales, wheezing or rhonchi       ASSESSMENT:    1. Paroxysmal atrial fibrillation (HCC)   2. Sick sinus syndrome (HCC)   3. Cardiac pacemaker in situ   4. Encounter for long-term (current) use of high-risk medication    PLAN:    In order of problems listed above:  #Paroxysmal atrial fibrillation #High risk med use-flecainide AF burden less than 1% based on pacemaker interrogation Continue Pradaxa for stroke prophylaxis Stop flecainide today   #Sick sinus syndrome #Permanent pacemaker in situ Device functioning appropriately.  Continue remote monitoring.   He would like to stop his omega-3 supplement.   Follow-up with the EP APP in 6 months.     I,Mathew Stumpf,acting as a Neurosurgeon for Lanier Prude, MD.,have documented all relevant documentation on the behalf of Lanier Prude, MD,as directed by  Lanier Prude, MD while in the presence of Lanier Prude, MD.  I, Lanier Prude, MD, have reviewed all documentation for this visit. The documentation on 06/27/23 for the exam, diagnosis, procedures, and orders are all accurate and complete.   Signed, Steffanie Dunn, MD, [facility], [facility] 06/27/2023 10:48 AM    Electrophysiology [facility]

## 2023-06-26 NOTE — Progress Notes (Unsigned)
  Electrophysiology Office Follow up Visit Note:    Date:  06/26/2023   ID:  Freida Busman Heidrick, DOB Mar 14, 1946, MRN [id number]  PCP:  [name], MD  [facility] Cardiologist:  Armanda Magic, MD  [facility] Electrophysiologist:  Lanier Prude, MD    Interval History:    Tc Kapusta is a 77 y.o. male who presents for a follow up visit.   Last seen June 14, 2022 for paroxysmal atrial fibrillation, sick sinus syndrome, permanent pacemaker and flecainide use.  Since I last saw the patient, he has been doing well.***       Past medical, surgical, social and family history were reviewed.  ROS:   Please see the history of present illness.    All other systems reviewed and are negative.  EKGs/Labs/Other Studies Reviewed:    The following studies were reviewed today:  June 27, 2023 in clinic device interrogation personally reviewed ***       Physical Exam:    VS:  There were no vitals taken for this visit.    Wt Readings from Last 3 Encounters:  06/14/22 198 lb (89.8 kg)  12/14/21 200 lb (90.7 kg)  11/15/21 203 lb (92.1 kg)     GEN: *** Well nourished, well developed in no acute distress CARDIAC: ***RRR, no murmurs, rubs, gallops.  Pacemaker pocket well-healed RESPIRATORY:  Clear to auscultation without rales, wheezing or rhonchi       ASSESSMENT:    1. Paroxysmal atrial fibrillation (HCC)   2. Sick sinus syndrome (HCC)   3. Cardiac pacemaker in situ   4. Encounter for long-term (current) use of high-risk medication    PLAN:    In order of problems listed above:   #Paroxysmal atrial fibrillation #High risk med use-flecainide AF burden less than 1% based on pacemaker interrogation Continue Pradaxa for stroke prophylaxis Continue flecainide. QRS and PR durations acceptable for continued flecainide use  #Sick sinus syndrome #Permanent pacemaker in situ Device functioning appropriately.  Continue remote monitoring.  Follow-up with the EP APP  in 6 months.     Signed, Steffanie Dunn, MD, [facility], [facility] 06/26/2023 1:48 PM    Electrophysiology [facility]

## 2023-06-27 ENCOUNTER — Encounter: Payer: Self-pay | Admitting: Cardiology

## 2023-06-27 ENCOUNTER — Ambulatory Visit: Payer: Medicare PPO | Attending: Cardiology | Admitting: Cardiology

## 2023-06-27 VITALS — BP 116/78 | HR 81 | Ht 73.5 in | Wt 200.0 lb

## 2023-06-27 DIAGNOSIS — I480 Paroxysmal atrial fibrillation: Secondary | ICD-10-CM | POA: Diagnosis not present

## 2023-06-27 DIAGNOSIS — Z950 Presence of cardiac pacemaker: Secondary | ICD-10-CM

## 2023-06-27 DIAGNOSIS — Z79899 Other long term (current) drug therapy: Secondary | ICD-10-CM

## 2023-06-27 DIAGNOSIS — I495 Sick sinus syndrome: Secondary | ICD-10-CM | POA: Diagnosis not present

## 2023-06-27 NOTE — Patient Instructions (Signed)
Medication Instructions:  STOP Flecainide STOP Omega 3 *If you need a refill on your cardiac medications before your next appointment, please call your pharmacy*   Follow-Up: At [facility], you and your health needs are our priority.  As part of our continuing mission to provide you with exceptional heart care, we have created designated Provider Care Teams.  These Care Teams include your primary Cardiologist (physician) and Advanced Practice Providers (APPs -  Physician Assistants and Nurse Practitioners) who all work together to provide you with the care you need, when you need it.  We recommend signing up for the patient portal called "MyChart".  Sign up information is provided on this After Visit Summary.  MyChart is used to connect with patients for Virtual Visits (Telemedicine).  Patients are able to view lab/test results, encounter notes, upcoming appointments, etc.  Non-urgent messages can be sent to your provider as well.   To learn more about what you can do with MyChart, go to [email or web address].    Your next appointment:   6 month(s)  Provider:   You will see one of the following Advanced Practice Providers on your designated Care Team:   Francis Dowse, [place] "[facility]" [place], [place]

## 2023-07-05 ENCOUNTER — Ambulatory Visit: Payer: Medicare PPO

## 2023-07-08 ENCOUNTER — Telehealth: Payer: Self-pay | Admitting: Cardiology

## 2023-07-08 ENCOUNTER — Ambulatory Visit (INDEPENDENT_AMBULATORY_CARE_PROVIDER_SITE_OTHER): Payer: Medicare PPO

## 2023-07-08 DIAGNOSIS — I495 Sick sinus syndrome: Secondary | ICD-10-CM

## 2023-07-08 NOTE — Telephone Encounter (Signed)
Transmission received 07/08/2023

## 2023-07-08 NOTE — Telephone Encounter (Signed)
Patient reports that he monitors his heart rate through his watch and it showed that his heart rate has gone into the 40s at night. He states that his PPM keep his heart rate around 60 usually so he is not sure why it has been dropping. He reports that he does occasionally become lightheaded during the day. Reports that he is staying hydrated. He did have a scheduled remote transmission this morning. Will have device clinic check on this.

## 2023-07-08 NOTE — Telephone Encounter (Signed)
STAT if HR is under 50 or over 120 (normal HR is 60-100 beats per minute)  What is your heart rate? 60 right now but during the night for about an hour it was 40 and below for the last 3 nights   Do you have a log of your heart rate readings (document readings)? Yes, but he'd like to speak with the nurse about them  Do you have any other symptoms? Lightheadedness and not as much stamina.

## 2023-07-08 NOTE — Telephone Encounter (Signed)
Pt states he has felt more fatigued since last device check.  Will evaluate changes made during visit and call Pt back.  He is in agreement.

## 2023-07-09 ENCOUNTER — Encounter: Payer: Self-pay | Admitting: Cardiology

## 2023-07-09 NOTE — Telephone Encounter (Signed)
Patient reports of increased fatigue, lightheaded with position change and while ambulating since stopping flecainide. Reports feeling better while taking flecainide. Denies chest pain or palpitations. AT/AF burden at OV 06/27/23 was <1% and as of 07/08/23 -->15.2%. Noted longest AF episode on most recent report was 14 hours 10 minutes, with peak VR's rate 132 bpm. Patient is currently in NSR and capture is present. Lead trend measurements available are WNL.   Explained to patient a pulse ox or watch when in AF may cause V-V intervals to vary. Patient was not awake during 40's rate to capture EKG during that time. Patient states when his watched alerted AF he did capture EKG but the watch was not consistently in the 40's.   Unsure is Adapta has hysteresis feature so I called Shari Prows, MDT rep who states he will look into further and follow up with me. Noted in additional features in parameters "sleep - off" but unsure if that is hysteresis.   BP: 116/68 (most recent BP 06/27/23).

## 2023-07-09 NOTE — Telephone Encounter (Signed)
Noted on recent in office report 06/27/23 changes made, see below.  Programmed from AAIR<=>DDDR to DDDR

## 2023-07-10 LAB — CUP PACEART REMOTE DEVICE CHECK
Battery Impedance: 2922 Ohm
Battery Remaining Longevity: 19 mo
Battery Voltage: 2.72 V
Brady Statistic AP VP Percent: 97 %
Brady Statistic AP VS Percent: 0 %
Brady Statistic AS VP Percent: 1 %
Brady Statistic AS VS Percent: 2 %
Date Time Interrogation Session: 20240722092356
Implantable Lead Connection Status: 753985
Implantable Lead Connection Status: 753985
Implantable Lead Implant Date: 20160303
Implantable Lead Implant Date: 20160303
Implantable Lead Location: 753859
Implantable Lead Location: 753860
Implantable Lead Model: 5076
Implantable Lead Model: 5092
Implantable Pulse Generator Implant Date: 20160303
Lead Channel Impedance Value: 536 Ohm
Lead Channel Impedance Value: 675 Ohm
Lead Channel Pacing Threshold Amplitude: 0.5 V
Lead Channel Pacing Threshold Amplitude: 0.75 V
Lead Channel Pacing Threshold Pulse Width: 0.4 ms
Lead Channel Pacing Threshold Pulse Width: 0.4 ms
Lead Channel Setting Pacing Amplitude: 2 V
Lead Channel Setting Pacing Amplitude: 2.5 V
Lead Channel Setting Pacing Pulse Width: 0.4 ms
Lead Channel Setting Sensing Sensitivity: 2.8 mV
Zone Setting Status: 755011
Zone Setting Status: 755011

## 2023-07-10 NOTE — Telephone Encounter (Signed)
Confirmed with Shari Prows, MDT rep., only the single chamber have hysteresis available.

## 2023-07-11 ENCOUNTER — Other Ambulatory Visit: Payer: Self-pay | Admitting: Cardiology

## 2023-07-12 ENCOUNTER — Other Ambulatory Visit: Payer: Self-pay | Admitting: Cardiology

## 2023-07-26 NOTE — Progress Notes (Signed)
Remote pacemaker transmission.   

## 2023-10-04 ENCOUNTER — Ambulatory Visit: Payer: Medicare PPO

## 2023-10-07 ENCOUNTER — Ambulatory Visit: Payer: Medicare PPO

## 2023-10-07 DIAGNOSIS — I495 Sick sinus syndrome: Secondary | ICD-10-CM | POA: Diagnosis not present

## 2023-10-08 LAB — CUP PACEART REMOTE DEVICE CHECK
Battery Impedance: 3057 Ohm
Battery Remaining Longevity: 18 mo
Battery Voltage: 2.71 V
Brady Statistic AP VP Percent: 99 %
Brady Statistic AP VS Percent: 0 %
Brady Statistic AS VP Percent: 0 %
Brady Statistic AS VS Percent: 0 %
Date Time Interrogation Session: 20241021153143
Implantable Lead Connection Status: 753985
Implantable Lead Connection Status: 753985
Implantable Lead Implant Date: 20160303
Implantable Lead Implant Date: 20160303
Implantable Lead Location: 753859
Implantable Lead Location: 753860
Implantable Lead Model: 5076
Implantable Lead Model: 5092
Implantable Pulse Generator Implant Date: 20160303
Lead Channel Impedance Value: 532 Ohm
Lead Channel Impedance Value: 655 Ohm
Lead Channel Pacing Threshold Amplitude: 0.625 V
Lead Channel Pacing Threshold Amplitude: 0.75 V
Lead Channel Pacing Threshold Pulse Width: 0.4 ms
Lead Channel Pacing Threshold Pulse Width: 0.4 ms
Lead Channel Setting Pacing Amplitude: 2 V
Lead Channel Setting Pacing Amplitude: 2.5 V
Lead Channel Setting Pacing Pulse Width: 0.4 ms
Lead Channel Setting Sensing Sensitivity: 2 mV
Zone Setting Status: 755011
Zone Setting Status: 755011

## 2023-10-24 NOTE — Progress Notes (Signed)
Remote pacemaker transmission.   

## 2023-12-24 NOTE — Progress Notes (Signed)
  Electrophysiology Office Note:   ID:  [name], DOB Jun 24, 1946, MRN [id number]  Primary Cardiologist: Wilbert Bihari, MD Electrophysiologist: OLE ONEIDA HOLTS, MD      History of Present Illness:   [name] is a 78 y.o. male with h/o SSS s/p PPM and paroxysmal AF seen today for routine electrophysiology followup.   Since last being seen in our clinic the patient reports doing well. Overall,  he denies chest pain, palpitations, dyspnea, PND, orthopnea, nausea, vomiting, dizziness, syncope, edema, weight gain, or early satiety.   Review of systems complete and found to be negative unless listed in HPI.   EP Information / Studies Reviewed:    EKG is ordered today. Personal review as below.  EKG Interpretation Date/Time:  Wednesday December 25 2023 10:09:37 EST Ventricular Rate:  70 PR Interval:  176 QRS Duration:  192 QT Interval:  464 QTC Calculation: 501 R Axis:   -74  Text Interpretation: AV dual-paced rhythm When compared with ECG of 27-Jun-2023 10:31, Vent. rate has decreased BY  11 BPM Confirmed by Lesia Sharper [phone]) on 12/25/2023 10:20:59 AM    PPM Interrogation-  reviewed in detail today,  See PACEART report.  Device History: Medtronic Dual Chamber PPM implanted 02/2015 for  SSS/ AF  Physical Exam:   VS:  BP 120/78 (BP Location: Left Arm, Patient Position: Sitting, Cuff Size: Normal)   Pulse 68   Resp 16   Ht 6' 1 (1.854 m)   Wt 205 lb (93 kg)   SpO2 98%   BMI 27.05 kg/m    Wt Readings from Last 3 Encounters:  12/25/23 205 lb (93 kg)  06/27/23 200 lb (90.7 kg)  06/14/22 198 lb (89.8 kg)     GEN: No acute distress  NECK: No JVD; No carotid bruits CARDIAC: Regular rate and rhythm, no murmurs, rubs, gallops RESPIRATORY:  Clear to auscultation without rales, wheezing or rhonchi  ABDOMEN: Soft, non-tender, non-distended EXTREMITIES:  No edema; No deformity   ASSESSMENT AND PLAN:    Sick sinus syndrome s/p Medtronic PPM  Permanent Pacemaker  in-situ Normal PPM function See Pace Art report No changes today  Paroxysmal AF Burden very low chronically. Continue pradaxa  150 mg BID for CHA2DS2/VASc of at least 4 Continue flecainide  100 mg BID    Disposition:   Follow up with EP APP in 6 months  Signed, Sharper Prentice Lesia, PA-C

## 2023-12-25 ENCOUNTER — Encounter: Payer: Self-pay | Admitting: Student

## 2023-12-25 ENCOUNTER — Ambulatory Visit: Payer: Medicare PPO | Attending: Student | Admitting: Student

## 2023-12-25 VITALS — BP 120/78 | HR 68 | Resp 16 | Ht 73.0 in | Wt 205.0 lb

## 2023-12-25 DIAGNOSIS — Z950 Presence of cardiac pacemaker: Secondary | ICD-10-CM

## 2023-12-25 DIAGNOSIS — I480 Paroxysmal atrial fibrillation: Secondary | ICD-10-CM

## 2023-12-25 DIAGNOSIS — I495 Sick sinus syndrome: Secondary | ICD-10-CM

## 2023-12-25 LAB — CUP PACEART INCLINIC DEVICE CHECK
Battery Impedance: 3390 Ohm
Battery Remaining Longevity: 16 mo
Battery Voltage: 2.71 V
Brady Statistic AP VP Percent: 100 %
Brady Statistic AP VS Percent: 0 %
Brady Statistic AS VP Percent: 0 %
Brady Statistic AS VS Percent: 0 %
Date Time Interrogation Session: 20250108123109
Implantable Lead Connection Status: 753985
Implantable Lead Connection Status: 753985
Implantable Lead Implant Date: 20160303
Implantable Lead Implant Date: 20160303
Implantable Lead Location: 753859
Implantable Lead Location: 753860
Implantable Lead Model: 5076
Implantable Lead Model: 5092
Implantable Pulse Generator Implant Date: 20160303
Lead Channel Impedance Value: 547 Ohm
Lead Channel Impedance Value: 659 Ohm
Lead Channel Pacing Threshold Amplitude: 0.5 V
Lead Channel Pacing Threshold Amplitude: 0.5 V
Lead Channel Pacing Threshold Amplitude: 0.625 V
Lead Channel Pacing Threshold Amplitude: 0.75 V
Lead Channel Pacing Threshold Pulse Width: 0.4 ms
Lead Channel Pacing Threshold Pulse Width: 0.4 ms
Lead Channel Pacing Threshold Pulse Width: 0.4 ms
Lead Channel Pacing Threshold Pulse Width: 0.4 ms
Lead Channel Setting Pacing Amplitude: 2 V
Lead Channel Setting Pacing Amplitude: 2.5 V
Lead Channel Setting Pacing Pulse Width: 0.4 ms
Lead Channel Setting Sensing Sensitivity: 2 mV
Zone Setting Status: 755011
Zone Setting Status: 755011

## 2023-12-25 NOTE — Patient Instructions (Signed)
Medication Instructions:  Your physician recommends that you continue on your current medications as directed. Please refer to the Current Medication list given to you today.  *If you need a refill on your cardiac medications before your next appointment, please call your pharmacy*  Lab Work: None ordered If you have labs (blood work) drawn today and your tests are completely normal, you will receive your results only by: MyChart Message (if you have MyChart) OR A paper copy in the mail If you have any lab test that is abnormal or we need to change your treatment, we will call you to review the results.  Follow-Up: At [facility], you and your health needs are our priority.  As part of our continuing mission to provide you with exceptional heart care, we have created designated Provider Care Teams.  These Care Teams include your primary Cardiologist (physician) and Advanced Practice Providers (APPs -  Physician Assistants and Nurse Practitioners) who all work together to provide you with the care you need, when you need it.  Your next appointment:   6 month(s)  Provider:   Steffanie Dunn, MD or [facility]" [place], [place]

## 2024-01-03 ENCOUNTER — Ambulatory Visit: Payer: Medicare PPO

## 2024-01-06 ENCOUNTER — Ambulatory Visit (INDEPENDENT_AMBULATORY_CARE_PROVIDER_SITE_OTHER): Payer: Medicare PPO

## 2024-01-06 DIAGNOSIS — I495 Sick sinus syndrome: Secondary | ICD-10-CM | POA: Diagnosis not present

## 2024-01-06 LAB — CUP PACEART REMOTE DEVICE CHECK
Battery Impedance: 3443 Ohm
Battery Remaining Longevity: 15 mo
Battery Voltage: 2.71 V
Brady Statistic AP VP Percent: 100 %
Brady Statistic AP VS Percent: 0 %
Brady Statistic AS VP Percent: 0 %
Brady Statistic AS VS Percent: 0 %
Date Time Interrogation Session: 20250120094504
Implantable Lead Connection Status: 753985
Implantable Lead Connection Status: 753985
Implantable Lead Implant Date: 20160303
Implantable Lead Implant Date: 20160303
Implantable Lead Location: 753859
Implantable Lead Location: 753860
Implantable Lead Model: 5076
Implantable Lead Model: 5092
Implantable Pulse Generator Implant Date: 20160303
Lead Channel Impedance Value: 544 Ohm
Lead Channel Impedance Value: 675 Ohm
Lead Channel Pacing Threshold Amplitude: 0.5 V
Lead Channel Pacing Threshold Amplitude: 0.75 V
Lead Channel Pacing Threshold Pulse Width: 0.4 ms
Lead Channel Pacing Threshold Pulse Width: 0.4 ms
Lead Channel Setting Pacing Amplitude: 2 V
Lead Channel Setting Pacing Amplitude: 2.5 V
Lead Channel Setting Pacing Pulse Width: 0.4 ms
Lead Channel Setting Sensing Sensitivity: 2 mV
Zone Setting Status: 755011
Zone Setting Status: 755011

## 2024-01-07 ENCOUNTER — Encounter: Payer: Self-pay | Admitting: Cardiology

## 2024-02-13 NOTE — Progress Notes (Signed)
 Remote pacemaker transmission.

## 2024-02-28 ENCOUNTER — Emergency Department (HOSPITAL_COMMUNITY)
Admission: EM | Admit: 2024-02-28 | Discharge: 2024-02-28 | Disposition: A | Discharge status: Home / Self Care | Attending: Emergency Medicine | Admitting: Emergency Medicine

## 2024-02-28 ENCOUNTER — Emergency Department (HOSPITAL_COMMUNITY)

## 2024-02-28 ENCOUNTER — Encounter (HOSPITAL_COMMUNITY): Payer: Self-pay | Admitting: *Deleted

## 2024-02-28 ENCOUNTER — Other Ambulatory Visit: Payer: Self-pay

## 2024-02-28 DIAGNOSIS — R42 Dizziness and giddiness: Secondary | ICD-10-CM | POA: Insufficient documentation

## 2024-02-28 DIAGNOSIS — R55 Syncope and collapse: Secondary | ICD-10-CM | POA: Diagnosis present

## 2024-02-28 LAB — CBC WITH DIFFERENTIAL/PLATELET
Abs Immature Granulocytes: 0.08 10*3/uL — ABNORMAL HIGH (ref 0.00–0.07)
Basophils Absolute: 0 10*3/uL (ref 0.0–0.1)
Basophils Relative: 0 %
Eosinophils Absolute: 0.1 10*3/uL (ref 0.0–0.5)
Eosinophils Relative: 1 %
HCT: 35.9 % — ABNORMAL LOW (ref 39.0–52.0)
Hemoglobin: 12.4 g/dL — ABNORMAL LOW (ref 13.0–17.0)
Immature Granulocytes: 1 %
Lymphocytes Relative: 10 %
Lymphs Abs: 0.9 10*3/uL (ref 0.7–4.0)
MCH: 33 pg (ref 26.0–34.0)
MCHC: 34.5 g/dL (ref 30.0–36.0)
MCV: 95.5 fL (ref 80.0–100.0)
Monocytes Absolute: 0.5 10*3/uL (ref 0.1–1.0)
Monocytes Relative: 5 %
Neutro Abs: 7.2 10*3/uL (ref 1.7–7.7)
Neutrophils Relative %: 83 %
Platelets: 136 10*3/uL — ABNORMAL LOW (ref 150–400)
RBC: 3.76 MIL/uL — ABNORMAL LOW (ref 4.22–5.81)
RDW: 13 % (ref 11.5–15.5)
WBC: 8.8 10*3/uL (ref 4.0–10.5)
nRBC: 0 % (ref 0.0–0.2)

## 2024-02-28 LAB — COMPREHENSIVE METABOLIC PANEL
ALT: 18 U/L (ref 0–44)
AST: 16 U/L (ref 15–41)
Albumin: 3.4 g/dL — ABNORMAL LOW (ref 3.5–5.0)
Alkaline Phosphatase: 26 U/L — ABNORMAL LOW (ref 38–126)
Anion gap: 11 (ref 5–15)
BUN: 20 mg/dL (ref 8–23)
CO2: 19 mmol/L — ABNORMAL LOW (ref 22–32)
Calcium: 8.6 mg/dL — ABNORMAL LOW (ref 8.9–10.3)
Chloride: 104 mmol/L (ref 98–111)
Creatinine, Ser: 1.23 mg/dL (ref 0.61–1.24)
GFR, Estimated: >60 mL/min (ref >60)
Glucose, Bld: 143 mg/dL — ABNORMAL HIGH (ref 70–99)
Potassium: 5 mmol/L (ref 3.5–5.1)
Sodium: 134 mmol/L — ABNORMAL LOW (ref 135–145)
Total Bilirubin: 0.8 mg/dL (ref 0.0–1.2)
Total Protein: 5.9 g/dL — ABNORMAL LOW (ref 6.5–8.1)

## 2024-02-28 LAB — TROPONIN I (HIGH SENSITIVITY)
Troponin I (High Sensitivity): 6 ng/L (ref <18)
Troponin I (High Sensitivity): 7 ng/L (ref <18)

## 2024-02-28 MED ORDER — SODIUM CHLORIDE 0.9 % IV SOLN: INTRAVENOUS | Status: DC

## 2024-02-28 NOTE — ED Notes (Signed)
 Pending callback from medtronic rep

## 2024-02-28 NOTE — ED Notes (Signed)
 Faxed report received, given to EDP.

## 2024-02-28 NOTE — ED Notes (Addendum)
 Attempted medtronic pacemaker interrogation, device not recognized. EDP notified. Contacting rep.

## 2024-02-28 NOTE — Discharge Instructions (Signed)
 Follow up with your doctor next week

## 2024-02-28 NOTE — ED Provider Notes (Signed)
 [place] EMERGENCY DEPARTMENT AT [facility] Provider Note   CSN: [id number] Arrival date & time: 02/28/24  1609     History  Chief Complaint  Patient presents with   Loss of Consciousness    Charvez Voorhies is a 78 y.o. male.  78 year old male presents after syncopal event just prior to arrival.  Patient states he was standing up and he was given speech and became dizzy and diaphoretic and he passed out.  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  Denied any palpitations prior to this.  No severe headaches, chest pain, shortness of breath.  The syncopal event itself was brief and when he awoke he had no bowel or bladder dysfunction.  No focal weakness.  Was not postictal.  States his pacemaker is towards the end of his life span.  EMS was called and patient was given half a liter of IV fluids.  CBG was 127.  Patient states he feels back to his baseline at this time.  Denies any new medications       Home Medications Prior to Admission medications   Medication Sig Start Date End Date Taking? Authorizing Provider  acetaminophen (TYLENOL) 650 MG CR tablet Take 650 mg by mouth. 2 tabs bid    [provider]  Calcium Carbonate-Vit D-Min (CALCIUM 1200 PO) Take by mouth daily.    [provider]  Cholecalciferol (D3 SUPER STRENGTH) 50 MCG (2000 UT) CAPS Take by mouth daily.    [provider]  Coenzyme Q-10 200 MG CAPS Take 1 capsule by mouth daily.    [provider]  COVID-19 mRNA vaccine [phone] (COMIRNATY) syringe Inject into the muscle. 10/22/22   Judyann Munson, MD  dabigatran (PRADAXA) 150 MG CAPS capsule TAKE 1 CAPSULE TWICE DAILY 02/13/23   Lanier Prude, MD  flecainide [facility]) 100 MG tablet TAKE 1 TABLET TWICE DAILY 07/11/23   Lanier Prude, MD  L-Lysine HCl 500 MG TABS Take 1 tablet by mouth daily. 05/17/17   [provider]  levocetirizine (XYZAL) 5 MG tablet Take 1 tablet by mouth daily. 09/19/20    [provider]  lisinopril (PRINIVIL,ZESTRIL) 20 MG tablet Take 20 mg by mouth at bedtime.    [provider]  metoprolol tartrate (LOPRESSOR) 100 MG tablet TAKE 1 TABLET TWICE DAILY 07/12/23   Lanier Prude, MD  OZEMPIC, 0.25 OR 0.5 MG/DOSE, 2 MG/3ML SOPN Indications: type 2 diabetes mellitus. Inject .25 mg once per week 11/20/23   [provider]  psyllium (METAMUCIL) 28 % packet Take 1 packet by mouth daily.    [provider]  rosuvastatin (CRESTOR) 10 MG tablet Take 10 mg by mouth daily.    [provider]  Tdap Leda Min) 5-2.5-18.5 LF-MCG/0.5 injection Inject into the muscle. 12/24/22   Judyann Munson, MD      Allergies    Atorvastatin    Review of Systems   Review of Systems  All other systems reviewed and are negative.   Physical Exam Updated Vital Signs BP 126/76 (BP Location: Right Arm)   Pulse 64   Temp 97.7 F (36.5 C) (Oral)   Resp 17   Wt 93 kg   SpO2 98%   BMI 27.05 kg/m  Physical Exam Vitals and nursing note reviewed.  Constitutional:      General: He is not in acute distress.    Appearance: Normal appearance. He is well-developed. He is not toxic-appearing.  HENT:     Head:  Normocephalic and atraumatic.  Eyes:     General: Lids are normal.     Conjunctiva/sclera: Conjunctivae normal.     Pupils: Pupils are equal, round, and reactive to light.  Neck:     Thyroid: No thyroid mass.     Trachea: No tracheal deviation.  Cardiovascular:     Rate and Rhythm: Normal rate and regular rhythm.     Heart sounds: Normal heart sounds. No murmur heard.    No gallop.  Pulmonary:     Effort: Pulmonary effort is normal. No respiratory distress.     Breath sounds: Normal breath sounds. No stridor. No decreased breath sounds, wheezing, rhonchi or rales.  Abdominal:     General: There is no distension.     Palpations: Abdomen is soft.     Tenderness: There is no abdominal tenderness. There is no rebound.   Musculoskeletal:        General: No tenderness. Normal range of motion.     Cervical back: Normal range of motion and neck supple.  Skin:    General: Skin is warm and dry.     Findings: No abrasion or rash.  Neurological:     Mental Status: He is alert and oriented to person, place, and time. Mental status is at baseline.     GCS: GCS eye subscore is 4. GCS verbal subscore is 5. GCS motor subscore is 6.     Cranial Nerves: No cranial nerve deficit.     Sensory: No sensory deficit.     Motor: Motor function is intact.  Psychiatric:        Attention and Perception: Attention normal.        Speech: Speech normal.        Behavior: Behavior normal.     ED Results / Procedures / Treatments   Labs (all labs ordered are listed, but only abnormal results are displayed) Labs Reviewed  CBC WITH DIFFERENTIAL/PLATELET  COMPREHENSIVE METABOLIC PANEL    EKG EKG Interpretation Date/Time:  Friday February 28 2024 16:20:27 EDT Ventricular Rate:  68 PR Interval:  136 QRS Duration:  191 QT Interval:  480 QTC Calculation: 511 R Axis:   -66  Text Interpretation: Sinus rhythm Left bundle branch block Confirmed by Lorre Nick ([id number]) on 02/28/2024 4:31:34 PM  Radiology No results found.  Procedures Procedures    Medications Ordered in ED Medications  0.9 %  sodium chloride infusion (has no administration in time range)    ED Course/ Medical Decision Making/ A&P                                 Medical Decision Making Amount and/or Complexity of Data Reviewed Labs: ordered. Radiology: ordered.  Risk Prescription drug management.   Patient is EKG shows left bundle branch block.  Unchanged from prior.  Patient is pacemaker interrogated and is functioning normally at this time.  Chest x-ray without acute findings.  Cardiac enzymes x 2 is negative.  Suspect the patient had a vagal episode.  Labs are reassuring here.  Will discharge home        Final Clinical Impression(s) /  ED Diagnoses Final diagnoses:  None    Rx / DC Orders ED Discharge Orders     None         Lorre Nick, MD 02/28/24 2203

## 2024-02-28 NOTE — ED Notes (Signed)
 Pacemaker interrogated, report sent, pending fax or call. No changes. PT alert, NAD, calm, interactive. Family at [facility].

## 2024-02-28 NOTE — ED Notes (Signed)
 Taken to CT.

## 2024-02-28 NOTE — ED Triage Notes (Signed)
 BIB [facility] from [facility] for syncopal episode. Lives in independent [place]. Pt was standing and speaking to group when he felt dizzy, nauseated and passed out and fell. Denies neck or back pain. Denies blood thinners. VSS. NSL 18g, L AC, NS 500cc IVF given. Has demand pacemaker. CBG 127. Alert, NAD, calm, interactive, speech clear, skin W&D. Denies pain or other sx at this time. EDP into see, at [facility].

## 2024-02-29 ENCOUNTER — Encounter: Payer: Self-pay | Admitting: Cardiology

## 2024-03-20 ENCOUNTER — Encounter: Payer: Self-pay | Admitting: Student

## 2024-03-20 ENCOUNTER — Ambulatory Visit: Attending: Student | Admitting: Student

## 2024-03-20 VITALS — BP 120/70 | HR 84 | Resp 16 | Ht 73.0 in

## 2024-03-20 DIAGNOSIS — R55 Syncope and collapse: Secondary | ICD-10-CM | POA: Diagnosis not present

## 2024-03-20 DIAGNOSIS — I495 Sick sinus syndrome: Secondary | ICD-10-CM | POA: Diagnosis not present

## 2024-03-20 DIAGNOSIS — Z950 Presence of cardiac pacemaker: Secondary | ICD-10-CM | POA: Diagnosis not present

## 2024-03-20 DIAGNOSIS — I480 Paroxysmal atrial fibrillation: Secondary | ICD-10-CM | POA: Diagnosis not present

## 2024-03-20 LAB — CUP PACEART INCLINIC DEVICE CHECK
Battery Impedance: 3600 Ohm
Battery Remaining Longevity: 14 mo
Battery Voltage: 2.69 V
Brady Statistic AP VP Percent: 100 %
Brady Statistic AP VS Percent: 0 %
Brady Statistic AS VP Percent: 0 %
Brady Statistic AS VS Percent: 0 %
Date Time Interrogation Session: 20250404112242
Implantable Lead Connection Status: 753985
Implantable Lead Connection Status: 753985
Implantable Lead Implant Date: 20160303
Implantable Lead Implant Date: 20160303
Implantable Lead Location: 753859
Implantable Lead Location: 753860
Implantable Lead Model: 5076
Implantable Lead Model: 5092
Implantable Pulse Generator Implant Date: 20160303
Lead Channel Impedance Value: 543 Ohm
Lead Channel Impedance Value: 666 Ohm
Lead Channel Pacing Threshold Amplitude: 0.5 V
Lead Channel Pacing Threshold Amplitude: 0.5 V
Lead Channel Pacing Threshold Amplitude: 0.75 V
Lead Channel Pacing Threshold Amplitude: 1.125 V
Lead Channel Pacing Threshold Pulse Width: 0.4 ms
Lead Channel Pacing Threshold Pulse Width: 0.4 ms
Lead Channel Pacing Threshold Pulse Width: 0.4 ms
Lead Channel Pacing Threshold Pulse Width: 0.4 ms
Lead Channel Setting Pacing Amplitude: 2.25 V
Lead Channel Setting Pacing Amplitude: 2.5 V
Lead Channel Setting Pacing Pulse Width: 0.4 ms
Lead Channel Setting Sensing Sensitivity: 2 mV
Zone Setting Status: 755011
Zone Setting Status: 755011

## 2024-03-20 NOTE — Patient Instructions (Signed)
 Medication Instructions:  Your physician recommends that you continue on your current medications as directed. Please refer to the Current Medication list given to you today.  *If you need a refill on your cardiac medications before your next appointment, please call your pharmacy*  Lab Work: None ordered If you have labs (blood work) drawn today and your tests are completely normal, you will receive your results only by: MyChart Message (if you have MyChart) OR A paper copy in the mail If you have any lab test that is abnormal or we need to change your treatment, we will call you to review the results.  Follow-Up: At [facility], you and your health needs are our priority.  As part of our continuing mission to provide you with exceptional heart care, our providers are all part of one team.  This team includes your primary Cardiologist (physician) and Advanced Practice Providers or APPs (Physician Assistants and Nurse Practitioners) who all work together to provide you with the care you need, when you need it.  Your next appointment:   6 month(s)  Provider:   Casimiro Needle "Mardelle Matte" Lanna Poche, PA-C      1st Floor: - Lobby - Registration  - Pharmacy  - Lab - Cafe  2nd Floor: - PV Lab - Diagnostic Testing (echo, CT, nuclear med)  3rd Floor: - Vacant  4th Floor: - TCTS (cardiothoracic surgery) - AFib Clinic - Structural Heart Clinic - Vascular Surgery  - Vascular Ultrasound  5th Floor: - HeartCare Cardiology (general and EP) - Clinical Pharmacy for coumadin, hypertension, lipid, weight-loss medications, and med management appointments    Valet parking services will be available as well.

## 2024-03-20 NOTE — Progress Notes (Signed)
  Electrophysiology Office Note:   ID:  [name], DOB [birth year], MRN [id number]  Primary Cardiologist: None Electrophysiologist: Lanier Prude, MD      History of Present Illness:   [name] is a 78 y.o. male with h/o SSS s/p PPM and PAF seen today for acute visit due to ED visit for syncope.    Patient reports he was leading a bible study when he began to feel diaphoretic and lightheaded. Denies h/o passing. States he had lost 20 lbs on ozempic.  Has seen his PCP and ozempic stopped and lisinopril decreased; Felt most likely vasovagal. He doesn't remember what BP was at the time but was OK in ED. He was mildly orthostatic in the ED.   Review of systems complete and found to be negative unless listed in HPI.   EP Information / Studies Reviewed:    EKG is not ordered today. EKG from 02/28/2024 reviewed which showed AV dual pacing at 68 bpm.       PPM Interrogation-  reviewed in detail today,  See PACEART report.  Arrhythmia/Device History Medtronic Dual Chamber PPM implanted 02/2015 for  SSS/ AF   Physical Exam:   VS:  BP 120/70 (BP Location: Right Arm, Patient Position: Sitting, Cuff Size: Normal)   Pulse 84   Resp 16   Ht 6\' 1"  (1.854 m)   SpO2 98%   BMI 27.05 kg/m    Wt Readings from Last 3 Encounters:  02/28/24 205 lb (93 kg)  12/25/23 205 lb (93 kg)  06/27/23 200 lb (90.7 kg)     GEN: No acute distress  NECK: No JVD; No carotid bruits CARDIAC: Regular rate and rhythm, no murmurs, rubs, gallops RESPIRATORY:  Clear to auscultation without rales, wheezing or rhonchi  ABDOMEN: Soft, non-tender, non-distended EXTREMITIES:  No edema; No deformity   ASSESSMENT AND PLAN:    Sick sinus syndrome s/p Medtronic PPM  Permanent PPM in-situ Normal PPM function See Pace Art report No changes today  Paroxysmal AF Continue flecainide 100 mg BID Continue pradaxa 150 mg BID for CHA2DS2VASc of at least 4  Syncope  Mild ortho stasis that day with gradual onset  and symptoms consistent with likely vasovagal syncope.  If recurs, would update echo, but otherwise continue to follow for now. He had ozempic stopped and lisinopril decreased.     Disposition:   Follow up with EP APP in 6 months  Signed, Graciella Freer, PA-C

## 2024-03-21 ENCOUNTER — Encounter: Payer: Self-pay | Admitting: Cardiology

## 2024-03-31 ENCOUNTER — Encounter (HOSPITAL_BASED_OUTPATIENT_CLINIC_OR_DEPARTMENT_OTHER): Payer: Self-pay

## 2024-03-31 ENCOUNTER — Emergency Department (HOSPITAL_BASED_OUTPATIENT_CLINIC_OR_DEPARTMENT_OTHER)

## 2024-03-31 ENCOUNTER — Other Ambulatory Visit: Payer: Self-pay

## 2024-03-31 ENCOUNTER — Observation Stay (HOSPITAL_BASED_OUTPATIENT_CLINIC_OR_DEPARTMENT_OTHER)
Admission: EM | Admit: 2024-03-31 | Discharge: 2024-04-02 | Disposition: A | Discharge status: Home / Self Care | Attending: Internal Medicine | Admitting: Internal Medicine

## 2024-03-31 DIAGNOSIS — Z950 Presence of cardiac pacemaker: Secondary | ICD-10-CM | POA: Insufficient documentation

## 2024-03-31 DIAGNOSIS — E785 Hyperlipidemia, unspecified: Secondary | ICD-10-CM | POA: Insufficient documentation

## 2024-03-31 DIAGNOSIS — F32A Depression, unspecified: Secondary | ICD-10-CM | POA: Diagnosis not present

## 2024-03-31 DIAGNOSIS — I959 Hypotension, unspecified: Principal | ICD-10-CM

## 2024-03-31 DIAGNOSIS — R2681 Unsteadiness on feet: Secondary | ICD-10-CM | POA: Insufficient documentation

## 2024-03-31 DIAGNOSIS — R2689 Other abnormalities of gait and mobility: Secondary | ICD-10-CM | POA: Diagnosis not present

## 2024-03-31 DIAGNOSIS — R6521 Severe sepsis with septic shock: Secondary | ICD-10-CM | POA: Insufficient documentation

## 2024-03-31 DIAGNOSIS — Z79899 Other long term (current) drug therapy: Secondary | ICD-10-CM | POA: Diagnosis not present

## 2024-03-31 DIAGNOSIS — I480 Paroxysmal atrial fibrillation: Secondary | ICD-10-CM | POA: Diagnosis not present

## 2024-03-31 DIAGNOSIS — I495 Sick sinus syndrome: Secondary | ICD-10-CM | POA: Diagnosis not present

## 2024-03-31 DIAGNOSIS — A419 Sepsis, unspecified organism: Secondary | ICD-10-CM | POA: Insufficient documentation

## 2024-03-31 DIAGNOSIS — N390 Urinary tract infection, site not specified: Secondary | ICD-10-CM | POA: Diagnosis not present

## 2024-03-31 DIAGNOSIS — I10 Essential (primary) hypertension: Secondary | ICD-10-CM | POA: Insufficient documentation

## 2024-03-31 DIAGNOSIS — M6281 Muscle weakness (generalized): Secondary | ICD-10-CM | POA: Diagnosis not present

## 2024-03-31 DIAGNOSIS — E871 Hypo-osmolality and hyponatremia: Secondary | ICD-10-CM | POA: Diagnosis not present

## 2024-03-31 DIAGNOSIS — R6520 Severe sepsis without septic shock: Secondary | ICD-10-CM

## 2024-03-31 DIAGNOSIS — D649 Anemia, unspecified: Secondary | ICD-10-CM

## 2024-03-31 DIAGNOSIS — E119 Type 2 diabetes mellitus without complications: Secondary | ICD-10-CM | POA: Diagnosis not present

## 2024-03-31 DIAGNOSIS — D500 Iron deficiency anemia secondary to blood loss (chronic): Secondary | ICD-10-CM | POA: Diagnosis not present

## 2024-03-31 DIAGNOSIS — R531 Weakness: Secondary | ICD-10-CM

## 2024-03-31 LAB — COMPREHENSIVE METABOLIC PANEL WITH GFR
ALT: 20 U/L (ref 0–44)
AST: 14 U/L — ABNORMAL LOW (ref 15–41)
Albumin: 3.8 g/dL (ref 3.5–5.0)
Alkaline Phosphatase: 30 U/L — ABNORMAL LOW (ref 38–126)
Anion gap: 13 (ref 5–15)
BUN: 25 mg/dL — ABNORMAL HIGH (ref 8–23)
CO2: 19 mmol/L — ABNORMAL LOW (ref 22–32)
Calcium: 9.4 mg/dL (ref 8.9–10.3)
Chloride: 101 mmol/L (ref 98–111)
Creatinine, Ser: 1.2 mg/dL (ref 0.61–1.24)
GFR, Estimated: >60 mL/min (ref >60)
Glucose, Bld: 222 mg/dL — ABNORMAL HIGH (ref 70–99)
Potassium: 4.4 mmol/L (ref 3.5–5.1)
Sodium: 133 mmol/L — ABNORMAL LOW (ref 135–145)
Total Bilirubin: 0.5 mg/dL (ref 0.0–1.2)
Total Protein: 7.1 g/dL (ref 6.5–8.1)

## 2024-03-31 LAB — LACTIC ACID, PLASMA: Lactic Acid, Venous: 1.8 mmol/L (ref 0.5–1.9)

## 2024-03-31 LAB — URINALYSIS, W/ REFLEX TO CULTURE (INFECTION SUSPECTED)
Bilirubin Urine: NEGATIVE
Glucose, UA: >1000 mg/dL — AB
Hgb urine dipstick: NEGATIVE
Ketones, ur: 15 mg/dL — AB
Nitrite: NEGATIVE
Protein, ur: NEGATIVE mg/dL
Specific Gravity, Urine: 1.022 (ref 1.005–1.030)
pH: 5 (ref 5.0–8.0)

## 2024-03-31 LAB — TROPONIN I (HIGH SENSITIVITY)
Troponin I (High Sensitivity): 10 ng/L (ref <18)
Troponin I (High Sensitivity): 9 ng/L (ref <18)

## 2024-03-31 LAB — CBC
HCT: 33.4 % — ABNORMAL LOW (ref 39.0–52.0)
Hemoglobin: 11.8 g/dL — ABNORMAL LOW (ref 13.0–17.0)
MCH: 33.3 pg (ref 26.0–34.0)
MCHC: 35.3 g/dL (ref 30.0–36.0)
MCV: 94.4 fL (ref 80.0–100.0)
Platelets: 232 10*3/uL (ref 150–400)
RBC: 3.54 MIL/uL — ABNORMAL LOW (ref 4.22–5.81)
RDW: 12.8 % (ref 11.5–15.5)
WBC: 9.9 10*3/uL (ref 4.0–10.5)
nRBC: 0 % (ref 0.0–0.2)

## 2024-03-31 LAB — RESP PANEL BY RT-PCR (RSV, FLU A&B, COVID)  RVPGX2
Influenza A by PCR: NEGATIVE
Influenza B by PCR: NEGATIVE
Resp Syncytial Virus by PCR: NEGATIVE
SARS Coronavirus 2 by RT PCR: NEGATIVE

## 2024-03-31 LAB — CBG MONITORING, ED: Glucose-Capillary: 211 mg/dL — ABNORMAL HIGH (ref 70–99)

## 2024-03-31 LAB — D-DIMER, QUANTITATIVE: D-Dimer, Quant: 0.5 ug{FEU}/mL (ref 0.00–0.50)

## 2024-03-31 MED ORDER — SODIUM CHLORIDE 0.9 % IV BOLUS
500.0000 mL | Freq: Once | INTRAVENOUS | Status: AC
  Administered 2024-03-31: 500 mL via INTRAVENOUS

## 2024-03-31 MED ORDER — NOREPINEPHRINE 4 MG/250ML-% IV SOLN
2.0000 ug/min | INTRAVENOUS | Status: DC
  Filled 2024-03-31: qty 250

## 2024-03-31 MED ORDER — LACTATED RINGERS IV BOLUS
1000.0000 mL | Freq: Once | INTRAVENOUS | Status: AC
  Administered 2024-03-31: 1000 mL via INTRAVENOUS

## 2024-03-31 MED ORDER — SODIUM CHLORIDE 0.9 % IV SOLN
250.0000 mL | INTRAVENOUS | Status: DC
  Administered 2024-03-31 (×2): 250 mL via INTRAVENOUS

## 2024-03-31 MED ORDER — IOHEXOL 300 MG/ML  SOLN
100.0000 mL | Freq: Once | INTRAMUSCULAR | Status: AC | PRN
  Administered 2024-03-31: 100 mL via INTRAVENOUS

## 2024-03-31 MED ORDER — SODIUM CHLORIDE 0.9 % IV SOLN
2.0000 g | Freq: Once | INTRAVENOUS | Status: AC
  Administered 2024-03-31: 2 g via INTRAVENOUS
  Filled 2024-03-31: qty 20

## 2024-03-31 NOTE — ED Triage Notes (Signed)
 In for eval of increasing weakness. [facility]. Ozempic started in January 2025 and has lost 25 lbs. Hypotensive today. Stopped Ozempic approx 2 weeks ago and started Jardiance.

## 2024-03-31 NOTE — ED Notes (Signed)
 Attempted to called for report 2x.

## 2024-03-31 NOTE — Progress Notes (Signed)
 Plan of Care Note for accepted transfer   Patient: [name] MRN: [id number]   DOA: 03/31/2024  Facility requesting transfer: [facility] Requesting Provider: Dr. Ranelle Buys Reason for transfer: Hypotension Facility course:  Patient is a 78 year old male with history of PAF on Pradaxa, SSS s/p PPM, T2DM, HTN, HLD who was diagnosed with Klebsiella oxytoca UTI on 4/12 at urgent care started on treatment with Macrobid.  Patient was on Ozempic which was discontinued a few weeks ago after losing 20 pounds and feeling persistently lightheaded and weak.  Patient presents to ED feeling profoundly weak and fatigued.  Hypotensive on arrival 75/50.  EKG shows AV paced rhythm.  Labs stable.  CT chest/abdomen/pelvis with contrast unrevealing for acute pathology.  Patient given 2.5 L of fluids with improvement of BP to low 100/60s maintaining MAP in the 70s.  Blood and urine cultures in process.  COVID, influenza, RSV negative.  Patient has been started on IV ceftriaxone.  Admission requested for further management.  Plan of care: The patient is accepted for admission to Progressive unit, at [facility] or [facility], first available.  Author: Edith Gores, MD 03/31/2024  Check [email or web address] for on-call coverage.  Nursing staff, Please call [facility] Admits & Consults System-Wide number on Amion as soon as patient's arrival, so appropriate admitting provider can evaluate the pt.

## 2024-03-31 NOTE — ED Provider Notes (Signed)
  Physical Exam  BP 109/64   Pulse 61   Temp 97.9 F (36.6 C) (Oral)   Resp 20   Ht 6\' 2"  (1.88 m)   Wt 81.6 kg   SpO2 95%   BMI 23.11 kg/m   Physical Exam Vitals and nursing note reviewed.  HENT:     Head: Normocephalic and atraumatic.  Eyes:     Pupils: Pupils are equal, round, and reactive to light.  Cardiovascular:     Rate and Rhythm: Normal rate and regular rhythm.  Pulmonary:     Effort: Pulmonary effort is normal.     Breath sounds: Normal breath sounds.  Abdominal:     Palpations: Abdomen is soft.     Tenderness: There is no abdominal tenderness.  Skin:    General: Skin is warm and dry.  Neurological:     Mental Status: He is alert.  Psychiatric:        Mood and Affect: Mood normal.     Procedures  Procedures  ED Course / MDM   Clinical Course as of 03/31/24 1844  Tue Mar 31, 2024  1304 Bacteria, UA(!): RARE [JL]  1304 WBC, UA: 21-50 [JL]  1304 Mickiel [place]): SMALL [JL]  1835 No acute findings on CT abdomen pelvis.  Patient remains normotensive after 2500 cc IV fluid.  No need to initiate Levophed at this time.  Given concern for UTI refractory to outpatient biotics with hypotension will admit to medicine.  Discussed admitting hospitalist accepts patient for admission [MP]    Clinical Course User Index [JL] Rosealee Concha, MD [MP] Sallyanne Creamer, DO   Medical Decision Making I, Rafael Bun DO, have assumed care of this patient from the previous provider pending CT abdomen pelvis, reevaluation of blood pressure, and admission  Amount and/or Complexity of Data Reviewed Labs: ordered. Decision-making details documented in ED Course. Radiology: ordered.  Risk Prescription drug management. Decision regarding hospitalization.          Sallyanne Creamer, DO 03/31/24 1844

## 2024-03-31 NOTE — ED Provider Notes (Signed)
 [place] [facility] AT [facility] Provider Note   CSN: [id number] Arrival date & time: 03/31/24  9562     History  Chief Complaint  Patient presents with   Weakness   Hypotension    Wildon Cuevas is a 78 y.o. male.   Weakness    78 year old male with medical history significant for paroxysmal atrial fibrillation on Pradaxa, HLD, sick sinus syndrome status post Medtronic pacemaker placement in 2016 presenting to the emergency department with generalized fatigue and weakness.  The patient had recently stopped Ozempic a few weeks ago after losing 20 pounds and feeling persistently lightheaded and weak.  He was recently seen at urgent care on 03/28/2024 for dysuria.  Initial soft blood pressure of 91/66, manually retaken and 104/68.  He was diagnosed with a UTI and was started on Macrobid which he has been taking.  He denies any chest pain, shortness of breath.  He was hypotensive today and feeling profoundly weak and fatigued.  Denies any abdominal pain.  Currently no cough, fevers or chills.  He has had decreased oral intake over the past few days.  He denies any rectal bleeding or dark tarry stools.  Home Medications Prior to Admission medications   Medication Sig Start Date End Date Taking? Authorizing Provider  nitrofurantoin, macrocrystal-monohydrate, (MACROBID) 100 MG capsule Take 100 mg by mouth 2 (two) times daily. 03/28/24 04/04/24 Yes [provider]  acetaminophen (TYLENOL) 650 MG CR tablet Take 650 mg by mouth. 2 tabs bid    [provider]  Calcium Carbonate-Vit D-Min (CALCIUM 1200 PO) Take by mouth daily.    [provider]  Cholecalciferol (D3 SUPER STRENGTH) 50 MCG (2000 UT) CAPS Take by mouth daily.    [provider]  Coenzyme Q-10 200 MG CAPS Take 1 capsule by mouth daily.    [provider]  Continuous Glucose Sensor (DEXCOM G6 SENSOR) MISC Inject into the skin. 1 sub-q every 10 days    [provider]  COVID-19 mRNA vaccine 2023-2024 (COMIRNATY) syringe Inject into the muscle. 10/22/22   Judyann Munson, MD  dabigatran (PRADAXA) 150 MG CAPS capsule TAKE 1 CAPSULE TWICE DAILY 02/13/23   Lanier Prude, MD  empagliflozin (JARDIANCE) 25 MG TABS tablet Take 1 tablet by mouth daily. 07/03/22 06/17/24  [provider]  flecainide (TAMBOCOR) 100 MG tablet TAKE 1 TABLET TWICE DAILY 07/11/23   Lanier Prude, MD  L-Lysine HCl 500 MG TABS Take 1 tablet by mouth daily. 05/17/17   [provider]  levocetirizine (XYZAL) 5 MG tablet Take 1 tablet by mouth daily. 09/19/20   [provider]  lisinopril (PRINIVIL,ZESTRIL) 20 MG tablet Take 10 mg by mouth at bedtime.    [provider]  metoprolol tartrate (LOPRESSOR) 100 MG tablet TAKE 1 TABLET TWICE DAILY 07/12/23   Lanier Prude, MD  psyllium (METAMUCIL) 28 % packet Take 1 packet by mouth daily.    [provider]  rosuvastatin (CRESTOR) 10 MG tablet Take 10 mg by mouth daily.    [provider]  sertraline (ZOLOFT) 100 MG tablet Take 100 mg by mouth daily.    [provider]  Tdap Leda Min) 5-2.5-18.5 LF-MCG/0.5 injection Inject into the muscle. 12/24/22   Judyann Munson, MD      Allergies    Atorvastatin    Review of Systems   Review of Systems  Neurological:  Positive for weakness.    Physical Exam Updated Vital Signs BP (!) 98/59   Pulse  61   Temp 98.7 F (37.1 C) (Rectal)   Resp 12   Ht 6\' 2"  (1.88 m)   Wt 81.6 kg   SpO2 94%   BMI 23.11 kg/m  Physical Exam Vitals and nursing note reviewed.  Constitutional:      General: He is not in acute distress.    Appearance: He is well-developed. He is not ill-appearing.  HENT:     Head: Normocephalic and atraumatic.     Mouth/Throat:     Mouth: Mucous membranes are dry.  Eyes:     Conjunctiva/sclera: Conjunctivae normal.  Cardiovascular:     Rate and Rhythm: Normal rate and regular rhythm.  Pulmonary:      Effort: Pulmonary effort is normal. No respiratory distress.     Breath sounds: Normal breath sounds.  Abdominal:     Palpations: Abdomen is soft.     Tenderness: There is no abdominal tenderness.  Musculoskeletal:        General: No swelling.     Cervical back: Neck supple.  Skin:    General: Skin is warm and dry.     Capillary Refill: Capillary refill takes 2 to 3 seconds.  Neurological:     General: No focal deficit present.     Mental Status: He is alert and oriented to person, place, and time. Mental status is at baseline.  Psychiatric:        Mood and Affect: Mood normal.     ED Results / Procedures / Treatments   Labs (all labs ordered are listed, but only abnormal results are displayed) Labs Reviewed  CBC - Abnormal; Notable for the following components:      Result Value   RBC 3.54 (*)    Hemoglobin 11.8 (*)    HCT 33.4 (*)    All other components within normal limits  COMPREHENSIVE METABOLIC PANEL WITH GFR - Abnormal; Notable for the following components:   Sodium 133 (*)    CO2 19 (*)    Glucose, Bld 222 (*)    BUN 25 (*)    AST 14 (*)    Alkaline Phosphatase 30 (*)    All other components within normal limits  URINALYSIS, W/ REFLEX TO CULTURE (INFECTION SUSPECTED) - Abnormal; Notable for the following components:   Glucose, UA >1,000 (*)    Ketones, ur 15 (*)    Leukocytes,Ua SMALL (*)    Bacteria, UA RARE (*)    All other components within normal limits  CBG MONITORING, ED - Abnormal; Notable for the following components:   Glucose-Capillary 211 (*)    All other components within normal limits  RESP PANEL BY RT-PCR (RSV, FLU A&B, COVID)  RVPGX2  CULTURE, BLOOD (ROUTINE X 2)  CULTURE, BLOOD (ROUTINE X 2)  URINE CULTURE  D-DIMER, QUANTITATIVE  LACTIC ACID, PLASMA  TROPONIN I (HIGH SENSITIVITY)  TROPONIN I (HIGH SENSITIVITY)    EKG EKG Interpretation Date/Time:  Tuesday March 31 2024 09:50:34 EDT Ventricular Rate:  71 PR Interval:  176 QRS  Duration:  170 QT Interval:  460 QTC Calculation: 499 R Axis:   -82  Text Interpretation: AV dual-paced rhythm Abnormal ECG When compared with ECG of 28-Feb-2024 16:20, PREVIOUS ECG IS PRESENT Confirmed by Ernie Avena ([id number]) on 03/31/2024 10:05:02 AM  Radiology DG Chest Port 1 View Result Date: 03/31/2024 CLINICAL DATA:  Weakness EXAM: PORTABLE CHEST 1 VIEW COMPARISON:  X-ray 02/28/2024. FINDINGS: No consolidation, pneumothorax or effusion. Normal cardiopericardial silhouette. No edema. Question some calcification along the left  side of the pericardium. Left upper chest pacemaker with leads along the right side of the heart. IMPRESSION: Pacemaker. No acute cardiopulmonary disease. Question some calcification along the left side of the pericardium, unchanged. Electronically Signed   By: Karen Kays M.D.   On: 03/31/2024 11:26    Procedures .Critical Care  Performed by: Ernie Avena, MD Authorized by: Ernie Avena, MD   Critical care provider statement:    Critical care time (minutes):  45   Critical care was necessary to treat or prevent imminent or life-threatening deterioration of the following conditions:  Shock   Critical care was time spent personally by me on the following activities:  Development of treatment plan with patient or surrogate, discussions with consultants, evaluation of patient's response to treatment, examination of patient, ordering and review of laboratory studies, ordering and review of radiographic studies, ordering and performing treatments and interventions, pulse oximetry, re-evaluation of patient's condition and review of old charts   Care discussed with: admitting provider       Medications Ordered in ED Medications  0.9 %  sodium chloride infusion (250 mLs Intravenous New Bag/Given 03/31/24 1313)  norepinephrine (LEVOPHED) 4mg  in (0.016 mg/mL) premix infusion ( Intravenous Not Given 03/31/24 1403)  lactated ringers bolus 1,000 mL (0 mLs  Intravenous Stopped 03/31/24 1213)  lactated ringers bolus 1,000 mL (1,000 mLs Intravenous New Bag/Given 03/31/24 1155)  cefTRIAXone (ROCEPHIN) 2 g in sodium chloride 0.9 % 100 mL IVPB (2 g Intravenous New Bag/Given 03/31/24 1421)  iohexol (OMNIPAQUE) 300 MG/ML solution 100 mL (100 mLs Intravenous Contrast Given 03/31/24 1347)    ED Course/ Medical Decision Making/ A&P Clinical Course as of 03/31/24 1455  Tue Mar 31, 2024  1304 Bacteria, UA(!): RARE [JL]  1304 WBC, UA: 21-50 [JL]  1304 Glori Luis): SMALL [JL]    Clinical Course User Index [JL] Ernie Avena, MD                                 Medical Decision Making Amount and/or Complexity of Data Reviewed Labs: ordered. Decision-making details documented in ED Course. Radiology: ordered.  Risk Prescription drug management. Decision regarding hospitalization.    78 year old male with medical history significant for paroxysmal atrial fibrillation on Pradaxa, HLD, sick sinus syndrome status post Medtronic pacemaker placement in 2016 presenting to the emergency department with generalized fatigue and weakness.  The patient had recently stopped Ozempic a few weeks ago after losing 20 pounds and feeling persistently lightheaded and weak.  He was recently seen at urgent care on 03/28/2024 for dysuria.  Initial soft blood pressure of 91/66, manually retaken and 104/68.  He was diagnosed with a UTI and was started on Macrobid which he has been taking.  He denies any chest pain, shortness of breath.  He was hypotensive today and feeling profoundly weak and fatigued.  Denies any abdominal pain.  Currently no cough, fevers or chills.  He has had decreased oral intake over the past few days.  He denies any rectal bleeding or dark tarry stools.  On arrival, the patient was afebrile, not tachycardic or tachypneic, hypotensive BP 75/50, saturating 96% on room air.  IV access was obtained on patient arrival the patient was started on IV fluid bolus  for volume resuscitation.  Differential diagnosis includes hypovolemia from dehydration/poor p.o. intake, septic shock from UTI, other source such as respiratory or intra-abdominal considered less likely given lack of symptoms, no history of  aortic aneurysm, symptoms not consistent with aortic dissection, patient denies chest pain, low concern for MI.  Considered PE.  Laboratory evaluation revealed CBG 211, COVID, flu, RSV PCR testing negative, lactic acid normal at 1.8, D-dimer negative at 0.50, urinalysis with persistent evidence of UTI with small leukocytes, 21-50 WBCs and rare bacteria present, CBC without a leukocytosis, mild anemia to 11.8.  CMP without significant electrolyte abnormality, mild non-anion gap acidosis present with a bicarbonate of 19, anion gap of 13, creatinine stable at 1.2, normal liver function, cardiac troponin 9.  Blood cultures and urine cultures were collected.  The patient was administered Rocephin given persistent urinary symptoms despite outpatient treatment with Macrobid.  He was administered additional 1 L IV fluid bolus.  His blood pressures remain soft but with maps greater than 65, Levophed was ordered but not initiated due to maps that were stable.  Chest x-ray revealed clear lungs and no active disease.  The patient's pacemaker was interrogated with no episodes of abnormal cardiac arrhythmias in the past week since last interrogation.  Persistent hypotension however, further evaluation was performed with CT chest abdomen pelvis which was pending at time of signout.  Plan at time of signout to follow-up results of CT imaging for any actionable findings, likely plan for admission for continued rehydration and close observation in the setting of the patient's hypotension.    Final Clinical Impression(s) / ED Diagnoses Final diagnoses:  Hypotension, unspecified hypotension type  Urinary tract infection without hematuria, site unspecified    Rx / DC Orders ED  Discharge Orders     None         Rosealee Concha, MD 03/31/24 1455

## 2024-04-01 DIAGNOSIS — R531 Weakness: Secondary | ICD-10-CM

## 2024-04-01 DIAGNOSIS — E871 Hypo-osmolality and hyponatremia: Secondary | ICD-10-CM | POA: Diagnosis not present

## 2024-04-01 DIAGNOSIS — R6521 Severe sepsis with septic shock: Secondary | ICD-10-CM | POA: Diagnosis not present

## 2024-04-01 DIAGNOSIS — N390 Urinary tract infection, site not specified: Principal | ICD-10-CM

## 2024-04-01 DIAGNOSIS — A419 Sepsis, unspecified organism: Secondary | ICD-10-CM | POA: Diagnosis not present

## 2024-04-01 DIAGNOSIS — Z79899 Other long term (current) drug therapy: Secondary | ICD-10-CM | POA: Diagnosis not present

## 2024-04-01 DIAGNOSIS — E119 Type 2 diabetes mellitus without complications: Secondary | ICD-10-CM | POA: Diagnosis not present

## 2024-04-01 DIAGNOSIS — I495 Sick sinus syndrome: Secondary | ICD-10-CM | POA: Diagnosis not present

## 2024-04-01 DIAGNOSIS — R6520 Severe sepsis without septic shock: Secondary | ICD-10-CM

## 2024-04-01 DIAGNOSIS — F32A Depression, unspecified: Secondary | ICD-10-CM | POA: Diagnosis not present

## 2024-04-01 DIAGNOSIS — Z950 Presence of cardiac pacemaker: Secondary | ICD-10-CM | POA: Diagnosis not present

## 2024-04-01 DIAGNOSIS — D500 Iron deficiency anemia secondary to blood loss (chronic): Secondary | ICD-10-CM | POA: Diagnosis not present

## 2024-04-01 DIAGNOSIS — I959 Hypotension, unspecified: Secondary | ICD-10-CM | POA: Diagnosis not present

## 2024-04-01 DIAGNOSIS — E785 Hyperlipidemia, unspecified: Secondary | ICD-10-CM | POA: Diagnosis not present

## 2024-04-01 DIAGNOSIS — D649 Anemia, unspecified: Secondary | ICD-10-CM

## 2024-04-01 DIAGNOSIS — I10 Essential (primary) hypertension: Secondary | ICD-10-CM | POA: Diagnosis not present

## 2024-04-01 DIAGNOSIS — I480 Paroxysmal atrial fibrillation: Secondary | ICD-10-CM | POA: Diagnosis not present

## 2024-04-01 LAB — BASIC METABOLIC PANEL WITH GFR
Anion gap: 11 (ref 5–15)
BUN: 21 mg/dL (ref 8–23)
CO2: 18 mmol/L — ABNORMAL LOW (ref 22–32)
Calcium: 8.4 mg/dL — ABNORMAL LOW (ref 8.9–10.3)
Chloride: 105 mmol/L (ref 98–111)
Creatinine, Ser: 1.25 mg/dL — ABNORMAL HIGH (ref 0.61–1.24)
GFR, Estimated: 59 mL/min — ABNORMAL LOW (ref >60)
Glucose, Bld: 115 mg/dL — ABNORMAL HIGH (ref 70–99)
Potassium: 4.1 mmol/L (ref 3.5–5.1)
Sodium: 134 mmol/L — ABNORMAL LOW (ref 135–145)

## 2024-04-01 LAB — CBC
HCT: 28.2 % — ABNORMAL LOW (ref 39.0–52.0)
Hemoglobin: 10 g/dL — ABNORMAL LOW (ref 13.0–17.0)
MCH: 33.2 pg (ref 26.0–34.0)
MCHC: 35.5 g/dL (ref 30.0–36.0)
MCV: 93.7 fL (ref 80.0–100.0)
Platelets: 208 10*3/uL (ref 150–400)
RBC: 3.01 MIL/uL — ABNORMAL LOW (ref 4.22–5.81)
RDW: 13 % (ref 11.5–15.5)
WBC: 7.1 10*3/uL (ref 4.0–10.5)
nRBC: 0 % (ref 0.0–0.2)

## 2024-04-01 LAB — GLUCOSE, CAPILLARY
Glucose-Capillary: 123 mg/dL — ABNORMAL HIGH (ref 70–99)
Glucose-Capillary: 159 mg/dL — ABNORMAL HIGH (ref 70–99)
Glucose-Capillary: 124 mg/dL — ABNORMAL HIGH (ref 70–99)
Glucose-Capillary: 111 mg/dL — ABNORMAL HIGH (ref 70–99)

## 2024-04-01 LAB — CK: Total CK: 31 U/L — ABNORMAL LOW (ref 49–397)

## 2024-04-01 LAB — LACTIC ACID, PLASMA: Lactic Acid, Venous: 1 mmol/L (ref 0.5–1.9)

## 2024-04-01 LAB — TSH: TSH: 1.226 u[IU]/mL (ref 0.350–4.500)

## 2024-04-01 LAB — CORTISOL: Cortisol, Plasma: 25.7 ug/dL

## 2024-04-01 MED ORDER — SODIUM CHLORIDE 0.9 % IV SOLN
2.0000 g | Freq: Two times a day (BID) | INTRAVENOUS | Status: DC
  Administered 2024-04-01 – 2024-04-02 (×3): 2 g via INTRAVENOUS
  Filled 2024-04-01 (×3): qty 12.5

## 2024-04-01 MED ORDER — ORAL CARE MOUTH RINSE: 15.0000 mL | OROMUCOSAL | Status: DC | PRN

## 2024-04-01 MED ORDER — SODIUM CHLORIDE 0.9 % IV SOLN: INTRAVENOUS | Status: DC

## 2024-04-01 MED ORDER — ACETAMINOPHEN 325 MG PO TABS: 650.0000 mg | ORAL_TABLET | Freq: Four times a day (QID) | ORAL | Status: DC | PRN

## 2024-04-01 MED ORDER — SODIUM CHLORIDE 0.9 % IV SOLN: INTRAVENOUS | Status: AC

## 2024-04-01 MED ORDER — ACETAMINOPHEN 650 MG RE SUPP: 650.0000 mg | Freq: Four times a day (QID) | RECTAL | Status: DC | PRN

## 2024-04-01 MED ORDER — DABIGATRAN ETEXILATE MESYLATE 150 MG PO CAPS
150.0000 mg | ORAL_CAPSULE | Freq: Two times a day (BID) | ORAL | Status: DC
  Administered 2024-04-01 (×2): 150 mg via ORAL
  Filled 2024-04-01 (×3): qty 1

## 2024-04-01 MED ORDER — INSULIN ASPART 100 UNIT/ML IJ SOLN
0.0000 [IU] | Freq: Three times a day (TID) | INTRAMUSCULAR | Status: DC
  Administered 2024-04-01 (×2): 1 [IU] via SUBCUTANEOUS
  Administered 2024-04-01: 2 [IU] via SUBCUTANEOUS
  Administered 2024-04-02: 1 [IU] via SUBCUTANEOUS

## 2024-04-01 MED ORDER — INSULIN ASPART 100 UNIT/ML IJ SOLN
0.0000 [IU] | Freq: Every day | INTRAMUSCULAR | Status: DC
Start: 2024-04-01 — End: 2024-04-02

## 2024-04-01 MED ORDER — FLECAINIDE ACETATE 50 MG PO TABS
100.0000 mg | ORAL_TABLET | Freq: Two times a day (BID) | ORAL | Status: DC
  Administered 2024-04-01 – 2024-04-02 (×3): 100 mg via ORAL
  Filled 2024-04-01 (×3): qty 2

## 2024-04-01 MED ORDER — SERTRALINE HCL 100 MG PO TABS
100.0000 mg | ORAL_TABLET | Freq: Every day | ORAL | Status: DC
  Administered 2024-04-01 – 2024-04-02 (×2): 100 mg via ORAL
  Filled 2024-04-01 (×2): qty 1

## 2024-04-01 MED ORDER — METOPROLOL TARTRATE 100 MG PO TABS: 100.0000 mg | ORAL_TABLET | Freq: Two times a day (BID) | ORAL | Status: DC

## 2024-04-01 MED ORDER — METOPROLOL TARTRATE 50 MG PO TABS
50.0000 mg | ORAL_TABLET | Freq: Two times a day (BID) | ORAL | Status: DC
  Administered 2024-04-01 – 2024-04-02 (×3): 50 mg via ORAL
  Filled 2024-04-01 (×3): qty 1

## 2024-04-01 NOTE — Evaluation (Signed)
 Occupational Therapy Evaluation Patient Details Name: [name] MRN: [id number] DOB: 18-Mar-1946 Today's Date: 04/01/2024   History of Present Illness   78 yr old male who presented with complaints of weakness and fatigue. Found to be hypotensive. Pt was diagnosed with Klebsiella UTI on 03/28/14 at urgent care. Also states he was on Ozempic which was discontinued 2 weeks ago after losing weight 20 lbs and feeling lightheaded/weak and has been switched to Jardiance. PMH: paroxysmal A-fib, sick sinus syndrome status post PPM, type 2 diabetes, hypertension, hyperlipidemia generalized weakness/fatigue,     Clinical Impressions The pt appears to be at or very near to his baseline level of functioning for self-care management, as he completed all assessed tasks with supervision or better. Specifically, he performed lower body dressing/donning underwear, socks, and pants, sit to stand, and ambulating in his room without an assistive device and simulated toileting. He does not require further OT services, therefore OT will sign off and recommend he return home at discharge. He stated he was receiving therapy at the [facility], and would like to resume such services.      If plan is discharge home, recommend the following:   Assistance with cooking/housework     Functional Status Assessment   Patient has not had a recent decline in their functional status     Equipment Recommendations   None recommended by OT     Recommendations for Other Services         Precautions/Restrictions   Restrictions Weight Bearing Restrictions Per Provider Order: No     Mobility Bed Mobility        General bed mobility comments: pt was received seated EOB    Transfers Overall transfer level: Needs assistance Equipment used: None Transfers: Sit to/from Stand Sit to Stand: Supervision                  Balance Overall balance assessment: Mild deficits observed, not formally tested        ADL either performed or assessed with clinical judgement   ADL Overall ADL's : Independent;Modified independent;At baseline                        Pertinent Vitals/Pain Pain Assessment Pain Assessment: No/denies pain     Extremity/Trunk Assessment Upper Extremity Assessment Upper Extremity Assessment: Overall WFL for tasks assessed;Right hand dominant   Lower Extremity Assessment Lower Extremity Assessment: Overall WFL for tasks assessed       Communication Communication Communication: No apparent difficulties   Cognition Arousal: Alert Behavior During Therapy: WFL for tasks assessed/performed               OT - Cognition Comments: Oriented x4                 Following commands: Intact                  Home Living Family/patient expects to be discharged to:: Other (Comment) Living Arrangements: Spouse/significant other Available Help at Discharge: Family Type of Home: Independent living facility ([facility]) Home Access: Level entry     Home Layout: One level     Bathroom Shower/Tub: Walk-in shower         Home Equipment: Pharmacist, hospital (2 wheels);Cane - single point          Prior Functioning/Environment Prior Level of Function : Independent/Modified Independent             Mobility Comments: independent with community ambulation,  driving ADLs Comments:  (He was independent with ADLs & meals were provided by the facility.)       OT Treatment/Interventions:   No further treatment needs identified        OT Frequency:   N/A       AM-PAC OT "6 Clicks" Daily Activity     Outcome Measure Help from another person eating meals?: None Help from another person taking care of personal grooming?: None Help from another person toileting, which includes using toliet, bedpan, or urinal?: None Help from another person bathing (including washing, rinsing, drying)?: None Help from another person to put on and  taking off regular upper body clothing?: None Help from another person to put on and taking off regular lower body clothing?: None 6 Click Score: 24   End of Session Equipment Utilized During Treatment: Other (comment) (N/A) Nurse Communication: Mobility status  Activity Tolerance: Patient tolerated treatment well Patient left: in chair;with call bell/phone within reach;with family/visitor present  OT Visit Diagnosis: Muscle weakness (generalized) (M62.81)                Time: 7829-5621 OT Time Calculation (min): 12 min Charges:  OT General Charges $OT Visit: 1 Visit OT Evaluation $OT Eval Low Complexity: 1 Low    Nassir Neidert L Izzabelle Bouley, OTR/L 04/01/2024, 2:16 PM

## 2024-04-01 NOTE — H&P (Signed)
 History and Physical    [name] NFA:[id number] DOB: Mar 25, 1946 DOA: 03/31/2024  PCP: [name]., PA-C  Patient coming from: [facility] ED  Chief Complaint: Generalized weakness  HPI: [name] is a 78 y.o. male with medical history significant of paroxysmal A-fib on Pradaxa, sick sinus syndrome status post PPM, type 2 diabetes, hypertension, hyperlipidemia generalized weakness/fatigue.  Patient states he was on Ozempic which was discontinued 2 weeks ago after losing weight and feeling lightheaded/weak.  He was switched to [place] instead.  He has a CGM and has not had any episodes of hypoglycemia.  He was recently diagnosed with Klebsiella oxytoca UTI on 4/12 at urgent care and started on Macrobid which he has been taking without much improvement.  Reports having chills.  His appetite has been poor.  Denies shortness of breath, chest pain, nausea, vomiting, abdominal pain, or diarrhea.  ED Course: Hypotensive on arrival with blood pressure 75/50.  Not febrile or tachycardic.  EKG showing paced rhythm.  Labs showing no leukocytosis, hemoglobin 11.8 (previously 12.4 on labs a month ago), sodium 133, bicarb 19, anion gap 13, glucose 222, BUN 25, creatinine 1.2 (stable), no elevation of LFTs, troponin negative x 2, D-dimer normal, lactic acid normal, COVID/influenza/RSV PCR negative, blood cultures collected.  UA with negative nitrite, small amount of leukocytes, and microscopy showing 21-50 WBCs and rare bacteria.  Urine culture pending.  CT chest/abdomen/pelvis with contrast negative for acute findings. Pacemaker interrogation revealed no episodes of abnormal cardiac arrhythmias in the past week since last interrogation. Patient was given ceftriaxone and 2.5 L IV fluid boluses in the ED.  Blood pressure subsequently improved.  Review of Systems:  Review of Systems  All other systems reviewed and are negative.   Past Medical History:  Diagnosis Date   Arthritis    Diabetes mellitus     exercise and diet controlled   Encounter for care of pacemaker    Hypertension    Paroxysmal atrial fibrillation [facility])    Sick sinus syndrome [facility])     Past Surgical History:  Procedure Laterality Date   JOINT REPLACEMENT  09/2011   left total knee   JOINT REPLACEMENT  06/2006   right total knee   KNEE CLOSED REDUCTION  12/05/2011   Procedure: CLOSED MANIPULATION KNEE;  Surgeon: Loanne Drilling;  Location: [facility] ORS;  Service: Orthopedics;  Laterality: Left;   PACEMAKER INSERTION  02/17/2015   MDT Adapta implanted by Dr Ambrose Pancoast in [place] for sick sinus syndrome     reports that he has never smoked. He has never used smokeless tobacco. He reports that he does not drink alcohol and does not use drugs.  Allergies  Allergen Reactions   Atorvastatin     myalgias    Family History  Problem Relation Age of Onset   Heart disease Mother        angioplasty x2   Hypertension Mother    Heart attack Father 77    Prior to Admission medications   Medication Sig Start Date End Date Taking? Authorizing Provider  acetaminophen (TYLENOL) 650 MG CR tablet Take 650 mg by mouth. 2 tabs bid   Yes [provider]  Cholecalciferol (D3 SUPER STRENGTH) 50 MCG (2000 UT) CAPS Take by mouth daily.   Yes [provider]  Coenzyme Q-10 200 MG CAPS Take 1 capsule by mouth daily.   Yes [provider]  Continuous Glucose Sensor (DEXCOM G6 SENSOR) MISC Inject into the skin. 1 sub-q every 10 days  Yes [provider]  dabigatran (PRADAXA) 150 MG CAPS capsule TAKE 1 CAPSULE TWICE DAILY 02/13/23  Yes Lanier Prude, MD  empagliflozin (JARDIANCE) 25 MG TABS tablet Take 1 tablet by mouth daily. 07/03/22 06/17/24 Yes [provider]  flecainide (TAMBOCOR) 100 MG tablet TAKE 1 TABLET TWICE DAILY 07/11/23  Yes Lanier Prude, MD  levocetirizine (XYZAL) 5 MG tablet Take 1 tablet by mouth daily. 09/19/20  Yes [provider]  lisinopril  (PRINIVIL,ZESTRIL) 20 MG tablet Take 10 mg by mouth at bedtime.   Yes [provider]  metoprolol tartrate (LOPRESSOR) 100 MG tablet TAKE 1 TABLET TWICE DAILY 07/12/23  Yes Lanier Prude, MD  nitrofurantoin, macrocrystal-monohydrate, (MACROBID) 100 MG capsule Take 100 mg by mouth 2 (two) times daily. 03/28/24 04/04/24 Yes [provider]  psyllium (METAMUCIL) 28 % packet Take 1 packet by mouth daily.   Yes [provider]  rosuvastatin (CRESTOR) 10 MG tablet Take 10 mg by mouth daily.   Yes [provider]  sertraline (ZOLOFT) 100 MG tablet Take 100 mg by mouth daily.   Yes [provider]  COVID-19 mRNA vaccine [phone] (COMIRNATY) syringe Inject into the muscle. 10/22/22   Judyann Munson, MD  Tdap Leda Min) 5-2.5-18.5 LF-MCG/0.5 injection Inject into the muscle. 12/24/22   Judyann Munson, MD    Physical Exam: Vitals:   03/31/24 2300 03/31/24 2315 04/01/24 0000 04/01/24 0116  BP: 96/61 101/63 104/63 106/67  Pulse: 64 64 72 68  Resp: 11 16 11 16   Temp:    (!) 97.4 F (36.3 C)  TempSrc:    Oral  SpO2: 93% 96% 95% 98%  Weight:    84.4 kg  Height:    6\' 2"  (1.88 m)    Physical Exam Vitals reviewed.  Constitutional:      General: He is not in acute distress.    Appearance: He is ill-appearing.     Comments: Having chills  HENT:     Head: Normocephalic and atraumatic.  Eyes:     Extraocular Movements: Extraocular movements intact.  Cardiovascular:     Rate and Rhythm: Normal rate and regular rhythm.     Pulses: Normal pulses.  Pulmonary:     Effort: Pulmonary effort is normal. No respiratory distress.     Breath sounds: Normal breath sounds. No wheezing or rales.  Abdominal:     General: Bowel sounds are normal.     Palpations: Abdomen is soft.     Tenderness: There is no abdominal tenderness. There is no guarding.  Musculoskeletal:     Cervical back: Normal range of motion.     Right lower leg: No edema.     Left lower leg: No  edema.  Skin:    General: Skin is warm and dry.  Neurological:     General: No focal deficit present.     Mental Status: He is alert and oriented to person, place, and time.     Labs on Admission: I have personally reviewed following labs and imaging studies  CBC: Recent Labs  Lab 03/31/24 1018  WBC 9.9  HGB 11.8*  HCT 33.4*  MCV 94.4  PLT 232   Basic Metabolic Panel: Recent Labs  Lab 03/31/24 1018  NA 133*  K 4.4  CL 101  CO2 19*  GLUCOSE 222*  BUN 25*  CREATININE 1.20  CALCIUM 9.4   GFR: Estimated Creatinine Clearance: 59.9 mL/min (by C-G formula based on SCr of 1.2 mg/dL). Liver Function Tests: Recent Labs  Lab 03/31/24 1018  AST 14*  ALT 20  ALKPHOS 30*  BILITOT 0.5  PROT 7.1  ALBUMIN 3.8   No results for input(s): "LIPASE", "AMYLASE" in the last 168 hours. No results for input(s): "AMMONIA" in the last 168 hours. Coagulation Profile: No results for input(s): "INR", "PROTIME" in the last 168 hours. Cardiac Enzymes: No results for input(s): "CKTOTAL", "CKMB", "CKMBINDEX", "TROPONINI" in the last 168 hours. BNP (last 3 results) No results for input(s): "PROBNP" in the last 8760 hours. HbA1C: No results for input(s): "HGBA1C" in the last 72 hours. CBG: Recent Labs  Lab 03/31/24 0955  GLUCAP 211*   Lipid Profile: No results for input(s): "CHOL", "HDL", "LDLCALC", "TRIG", "CHOLHDL", "LDLDIRECT" in the last 72 hours. Thyroid Function Tests: No results for input(s): "TSH", "T4TOTAL", "FREET4", "T3FREE", "THYROIDAB" in the last 72 hours. Anemia Panel: No results for input(s): "VITAMINB12", "FOLATE", "FERRITIN", "TIBC", "IRON", "RETICCTPCT" in the last 72 hours. Urine analysis:    Component Value Date/Time   COLORURINE YELLOW 03/31/2024 1134   APPEARANCEUR CLEAR 03/31/2024 1134   LABSPEC 1.022 03/31/2024 1134   PHURINE 5.0 03/31/2024 1134   GLUCOSEU >1,000 (A) 03/31/2024 1134   HGBUR NEGATIVE 03/31/2024 1134   BILIRUBINUR NEGATIVE 03/31/2024  1134   KETONESUR 15 (A) 03/31/2024 1134   PROTEINUR NEGATIVE 03/31/2024 1134   UROBILINOGEN 0.2 09/11/2011 1510   NITRITE NEGATIVE 03/31/2024 1134   LEUKOCYTESUR SMALL (A) 03/31/2024 1134    Radiological Exams on Admission: CT CHEST ABDOMEN PELVIS W CONTRAST Result Date: 03/31/2024 CLINICAL DATA:  Sepsis.  UTI.  Hypotensive. EXAM: CT CHEST, ABDOMEN, AND PELVIS WITH CONTRAST TECHNIQUE: Multidetector CT imaging of the chest, abdomen and pelvis was performed following the standard protocol during bolus administration of intravenous contrast. RADIATION DOSE REDUCTION: This exam was performed according to the departmental dose-optimization program which includes automated exposure control, adjustment of the mA and/or kV according to patient size and/or use of iterative reconstruction technique. CONTRAST:  100mL OMNIPAQUE IOHEXOL 300 MG/ML  SOLN COMPARISON:  None Available. FINDINGS: CT CHEST FINDINGS Cardiovascular: Aorta is normal in size. Heart is mildly enlarged. There is no pericardial effusion pacemaker is present. There are atherosclerotic calcifications of the aorta. Mediastinum/Nodes: No enlarged mediastinal, hilar, or axillary lymph nodes. Thyroid gland, trachea, and esophagus demonstrate no significant findings. Lungs/Pleura: The lungs are clear. There is no pleural effusion or pneumothorax. Musculoskeletal: No chest wall mass or suspicious bone lesions identified. CT ABDOMEN PELVIS FINDINGS Hepatobiliary: No focal liver abnormality is seen. No gallstones, gallbladder wall thickening, or biliary dilatation. Pancreas: Unremarkable. No pancreatic ductal dilatation or surrounding inflammatory changes. Spleen: Normal in size without focal abnormality. Adrenals/Urinary Tract: Adrenal glands are unremarkable. Kidneys are normal, without renal calculi, focal lesion, or hydronephrosis. Bladder is unremarkable. Stomach/Bowel: Stomach is within normal limits. Appendix appears normal. No evidence of bowel wall  thickening, distention, or inflammatory changes. There is sigmoid colon diverticulosis. Vascular/Lymphatic: Aortic atherosclerosis. No enlarged abdominal or pelvic lymph nodes. Reproductive: Prostate gland is enlarged. Other: No abdominal wall hernia or abnormality. No abdominopelvic ascites. Musculoskeletal: No acute or significant osseous findings. IMPRESSION: 1. No acute localizing process in the chest, abdomen or pelvis. 2. Mild cardiomegaly. 3. Sigmoid colon diverticulosis. 4. Prostatomegaly. 5. Aortic atherosclerosis. Electronically Signed   By: Tyron Gallon M.D.   On: 03/31/2024 16:22   DG Chest Port 1 View Result Date: 03/31/2024 CLINICAL DATA:  Weakness EXAM: PORTABLE CHEST 1 VIEW COMPARISON:  X-ray 02/28/2024. FINDINGS: No consolidation, pneumothorax or effusion. Normal cardiopericardial silhouette. No edema. Question some  calcification along the left side of the pericardium. Left upper chest pacemaker with leads along the right side of the heart. IMPRESSION: Pacemaker. No acute cardiopulmonary disease. Question some calcification along the left side of the pericardium, unchanged. Electronically Signed   By: Adrianna Horde M.D.   On: 03/31/2024 11:26    Assessment and Plan  Severe sepsis secondary to UTI Recently diagnosed with Klebsiella oxytoca at urgent care on 4/12 and failed outpatient antibiotic therapy with Macrobid.  UA with evidence of pyuria and bacteriuria.  Hypotension has resolved after IV fluids and lactate was normal in the ED.  He is afebrile, however, upon arrival to the hospital tonight patient developed chills and was slightly tachycardic.  Tachycardia now improved but continues to have chills. Will continue antibiotic coverage with cefepime.  Continue IV fluid hydration.  Trend WBC count and lactate.  Follow-up urine and blood cultures.  Generalized weakness/fatigue Likely due to UTI.  Troponin negative x 2.  COVID/influenza/RSV PCR negative.  CT chest/abdomen/pelvis with  contrast negative for acute findings. Pacemaker interrogation revealed no episodes of abnormal cardiac arrhythmias in the past week since last interrogation.  Check TSH and cortisol level.  PT/OT eval, fall precautions.  Mild normocytic anemia No signs or symptoms of bleeding.  No significant drop in hemoglobin compared to labs a month ago.  Continue to monitor CBC.  Mild hyponatremia Likely due to poor p.o. intake.  Continue IV fluid hydration and monitor labs.  Mild normal anion gap metabolic acidosis Continue IV fluid hydration and monitor labs.  Paroxysmal A-fib Continue Pradaxa, metoprolol, and flecainide  Sick sinus syndrome status post PPM Pacemaker interrogation revealed no episodes of abnormal cardiac arrhythmias in the past week since last interrogation.   Type 2 diabetes A1c 6.8 on 02/19/2024.  Placed on sensitive sliding scale insulin ACHS.  Hyperlipidemia Hold statin and check CK.  Depression Continue Zoloft.  DVT prophylaxis: Pradaxa Code Status: Full Code (discussed with the patient) Family Communication: No family available at this time. Level of care: Progressive Care Unit Admission status: It is my clinical opinion that referral for OBSERVATION is reasonable and necessary in this patient based on the above information provided. The aforementioned taken together are felt to place the patient at high risk for further clinical deterioration. However, it is anticipated that the patient may be medically stable for discharge from the hospital within 24 to 48 hours.  Juliette Oh MD [facility]  If 7PM-7AM, please contact night-coverage [email or web address]  04/01/2024, 2:07 AM

## 2024-04-01 NOTE — Progress Notes (Signed)
  PROGRESS NOTE  Patient admitted earlier this morning. See H&P.   [name] is a 78 y.o. male with medical history significant of paroxysmal A-fib on Pradaxa, sick sinus syndrome status post PPM, type 2 diabetes, hypertension, hyperlipidemia generalized weakness/fatigue.  Patient states he was on Ozempic which was discontinued 2 weeks ago after losing weight and feeling lightheaded/weak.  He was switched to Jardiance instead.  He has a CGM and has not had any episodes of hypoglycemia.  He was recently diagnosed with Klebsiella oxytoca UTI on 4/12 at urgent care and started on Macrobid which he has been taking without much improvement.  Reports having chills.  His appetite has been poor.   Patient states that his weakness has somewhat improved since hospitalization.  Blood pressure remains on the lower side but improved at 99/52.  Blood cultures and urine culture is pending.  Some nausea without vomiting.  Overall symptomatically improved but not back to baseline.  Wife and daughter are at bedside.  Failed outpatient antibiotic therapy with Macrobid.  Currently remains on IV cefepime.  IV fluid. Dose of Lopressor is decreased to 50 mg twice daily in setting of hypotension.  Patient also has had significant weight loss on Ozempic. PT OT   Status is: Observation The patient remains OBS appropriate and will d/c before 2 midnights.   Daren Eck, DO [facility] 04/01/2024, 4:11 PM  Available via Epic secure chat 7am-7pm After these hours, please refer to coverage provider listed on [email or web address]

## 2024-04-01 NOTE — Plan of Care (Signed)
°  Problem: Clinical Measurements: °Goal: Respiratory complications will improve °Outcome: Progressing °Goal: Cardiovascular complication will be avoided °Outcome: Progressing °  °Problem: Elimination: °Goal: Will not experience complications related to urinary retention °Outcome: Progressing °  °Problem: Safety: °Goal: Ability to remain free from injury will improve °Outcome: Progressing °  °

## 2024-04-01 NOTE — Evaluation (Signed)
 Physical Therapy Evaluation Patient Details Name: Michaeljames Milnes MRN: [id number] DOB: 05/19/1946 Today's Date: 04/01/2024  History of Present Illness  78 y.o. male presented to ED 4/15 with complaints of weakness and fatigue. Found to be hypotensive. UA with negative nitrite, small amount of leukocytes, and microscopy showing 21-50 WBCs and rare bacteria.Pt diagnosed with Klebsiella oxytoca UTI on 4/12 at urgent care and started on Macrobid without much improvement. Also states he was on Ozempic which was discontinued 2 weeks ago after losing weight 20 lbs and feeling lightheaded/weak and has been switched to J[place]. Admitted for observation 4/15. PMH: paroxysmal A-fib on Pradaxa, sick sinus syndrome status post PPM, type 2 diabetes, hypertension, hyperlipidemia generalized weakness/fatigue,  Clinical Impression  PTA pt living with wife in single story garden home at [place] with level entry. Pt reports increased fatigue since after Christmas and inability to walk the 1-2 miles he was used to, pt currently is independent with limited community ambulation, ADLs and iADLs, and drives. Pt is currently limited in safe mobility by decreased strength and associated balance. Pt is min A for bed mobility, and supervision for transfers and ambulation in hallway. PT recommends return to PT at [facility]. PT will continue to follow acutely.         If plan is discharge home, recommend the following: A little help with walking and/or transfers   Can travel by private vehicle     Yes   Equipment Recommendations None recommended by PT     Functional Status Assessment Patient has had a recent decline in their functional status and demonstrates the ability to make significant improvements in function in a reasonable and predictable amount of time.     Precautions / Restrictions Precautions Precautions: None Restrictions Weight Bearing Restrictions Per Provider Order: No      Mobility  Bed  Mobility Overal bed mobility: Needs Assistance Bed Mobility: Supine to Sit     Supine to sit: Min assist     General bed mobility comments: able to manage LE off bed but requires use of bedrail and minA for bringing trunk to upright.    Transfers Overall transfer level: Needs assistance Equipment used: None Transfers: Sit to/from Stand Sit to Stand: Supervision           General transfer comment: good power up, increased time and effort for achieving balance but no outside assist required    Ambulation/Gait Ambulation/Gait assistance: Supervision Gait Distance (Feet): 250 Feet Assistive device: None Gait Pattern/deviations: Step-through pattern, Narrow base of support, Decreased stride length Gait velocity: slowed Gait velocity interpretation: 1.31 - 2.62 ft/sec, indicative of limited community ambulator   General Gait Details: supervision for safety, narrowed BoS, mildly unsteady, no overt LoB      Balance Overall balance assessment: Mild deficits observed, not formally tested                                           Pertinent Vitals/Pain Pain Assessment Pain Assessment: No/denies pain    Home Living Family/patient expects to be discharged to:: Other (Comment) (ILF) Living Arrangements: Spouse/significant other (Daughter is Interior and spatial designer at [employer]) Available Help at Discharge: Available 24 hours/day Type of Home: House Home Access: Level entry       Home Layout: One level Home Equipment: Cane - single point;Grab bars - toilet;Grab bars - tub/shower;Hand held shower head;Shower seat - built in  Prior Function Prior Level of Function : Independent/Modified Independent             Mobility Comments: independent with community ambulation, driving ADLs Comments: cooking provided by [facility]     Extremity/Trunk Assessment   Upper Extremity Assessment Upper Extremity Assessment: Defer to OT evaluation    Lower Extremity  Assessment Lower Extremity Assessment: Generalized weakness    Cervical / Trunk Assessment Cervical / Trunk Assessment: Normal  Communication   Communication Communication: No apparent difficulties    Cognition Arousal: Alert Behavior During Therapy: WFL for tasks assessed/performed   PT - Cognitive impairments: No apparent impairments                         Following commands: Intact       Cueing Cueing Techniques: Verbal cues     General Comments General comments (skin integrity, edema, etc.): BP supine 100/54, sitting 108/52 standing 99/54, after ambulation 97/65, pt with no reports of dizziness or lightheadedness HR at rest 65 bpm, max noted with ambulation 89bpm, wife and daughter present at end of session.        Assessment/Plan    PT Assessment Patient needs continued PT services  PT Problem List Decreased strength;Decreased activity tolerance;Decreased balance;Decreased mobility;Cardiopulmonary status limiting activity       PT Treatment Interventions DME instruction;Gait training;Functional mobility training;Therapeutic activities;Therapeutic exercise;Balance training;Patient/family education    PT Goals (Current goals can be found in the Care Plan section)  Acute Rehab PT Goals PT Goal Formulation: With patient/family Time For Goal Achievement: 04/15/24 Potential to Achieve Goals: Good    Frequency Min 2X/week        AM-PAC PT "6 Clicks" Mobility  Outcome Measure Help needed turning from your back to your side while in a flat bed without using bedrails?: None Help needed moving from lying on your back to sitting on the side of a flat bed without using bedrails?: A Little Help needed moving to and from a bed to a chair (including a wheelchair)?: None Help needed standing up from a chair using your arms (e.g., wheelchair or bedside chair)?: None Help needed to walk in hospital room?: None Help needed climbing 3-5 steps with a railing? : A  Lot 6 Click Score: 21    End of Session Equipment Utilized During Treatment: Gait belt Activity Tolerance: Patient tolerated treatment well Patient left: in bed;Other (comment) (EoB wife assisting with bathing) Nurse Communication: Mobility status PT Visit Diagnosis: Unsteadiness on feet (R26.81);Other abnormalities of gait and mobility (R26.89);Muscle weakness (generalized) (M62.81)    Time: 1610-9604 PT Time Calculation (min) (ACUTE ONLY): 26 min   Charges:   PT Evaluation $PT Eval Moderate Complexity: 1 Mod PT Treatments $Gait Training: 8-22 mins PT General Charges $$ ACUTE PT VISIT: 1 Visit         Keyshun Elpers B. Jewel Mortimer PT, DPT Acute Rehabilitation Services Please use secure chat or  Call Office [phone]   Verlie Glisson [facility] 04/01/2024, 9:56 AM

## 2024-04-02 ENCOUNTER — Other Ambulatory Visit: Payer: Self-pay | Admitting: Cardiology

## 2024-04-02 DIAGNOSIS — N390 Urinary tract infection, site not specified: Secondary | ICD-10-CM | POA: Diagnosis not present

## 2024-04-02 DIAGNOSIS — I480 Paroxysmal atrial fibrillation: Secondary | ICD-10-CM

## 2024-04-02 LAB — BASIC METABOLIC PANEL WITH GFR
Anion gap: 7 (ref 5–15)
BUN: 15 mg/dL (ref 8–23)
CO2: 20 mmol/L — ABNORMAL LOW (ref 22–32)
Calcium: 8.2 mg/dL — ABNORMAL LOW (ref 8.9–10.3)
Chloride: 111 mmol/L (ref 98–111)
Creatinine, Ser: 0.99 mg/dL (ref 0.61–1.24)
GFR, Estimated: >60 mL/min (ref >60)
Glucose, Bld: 95 mg/dL (ref 70–99)
Potassium: 4.2 mmol/L (ref 3.5–5.1)
Sodium: 138 mmol/L (ref 135–145)

## 2024-04-02 LAB — CBC
HCT: 25.8 % — ABNORMAL LOW (ref 39.0–52.0)
Hemoglobin: 9 g/dL — ABNORMAL LOW (ref 13.0–17.0)
MCH: 33 pg (ref 26.0–34.0)
MCHC: 34.9 g/dL (ref 30.0–36.0)
MCV: 94.5 fL (ref 80.0–100.0)
Platelets: 211 10*3/uL (ref 150–400)
RBC: 2.73 MIL/uL — ABNORMAL LOW (ref 4.22–5.81)
RDW: 13.2 % (ref 11.5–15.5)
WBC: 5.6 10*3/uL (ref 4.0–10.5)
nRBC: 0.4 % — ABNORMAL HIGH (ref 0.0–0.2)

## 2024-04-02 LAB — URINE CULTURE: Culture: 40000 — AB

## 2024-04-02 LAB — GLUCOSE, CAPILLARY: Glucose-Capillary: 121 mg/dL — ABNORMAL HIGH (ref 70–99)

## 2024-04-02 MED ORDER — SULFAMETHOXAZOLE-TRIMETHOPRIM 800-160 MG PO TABS
1.0000 | ORAL_TABLET | Freq: Two times a day (BID) | ORAL | 0 refills | Status: AC
Start: 2024-04-02 — End: 2024-04-12

## 2024-04-02 MED ORDER — METOPROLOL TARTRATE 50 MG PO TABS: 50.0000 mg | ORAL_TABLET | Freq: Two times a day (BID) | ORAL | 0 refills | Status: DC

## 2024-04-02 NOTE — Plan of Care (Signed)
  Problem: Education: Goal: Knowledge of General Education information will improve Description: Including pain rating scale, medication(s)/side effects and non-pharmacologic comfort measures Outcome: Adequate for Discharge   Problem: Health Behavior/Discharge Planning: Goal: Ability to manage health-related needs will improve Outcome: Adequate for Discharge   Problem: Clinical Measurements: Goal: Ability to maintain clinical measurements within normal limits will improve Outcome: Adequate for Discharge Goal: Will remain free from infection Outcome: Adequate for Discharge Goal: Diagnostic test results will improve Outcome: Adequate for Discharge Goal: Respiratory complications will improve Outcome: Adequate for Discharge Goal: Cardiovascular complication will be avoided Outcome: Adequate for Discharge   Problem: Activity: Goal: Risk for activity intolerance will decrease Outcome: Adequate for Discharge   Problem: Nutrition: Goal: Adequate nutrition will be maintained Outcome: Adequate for Discharge   Problem: Coping: Goal: Level of anxiety will decrease Outcome: Adequate for Discharge   Problem: Elimination: Goal: Will not experience complications related to bowel motility Outcome: Adequate for Discharge Goal: Will not experience complications related to urinary retention Outcome: Adequate for Discharge   Problem: Pain Managment: Goal: General experience of comfort will improve and/or be controlled Outcome: Adequate for Discharge   Problem: Safety: Goal: Ability to remain free from injury will improve Outcome: Adequate for Discharge   Problem: Skin Integrity: Goal: Risk for impaired skin integrity will decrease Outcome: Adequate for Discharge   Problem: Education: Goal: Ability to describe self-care measures that may prevent or decrease complications (Diabetes Survival Skills Education) will improve Outcome: Adequate for Discharge Goal: Individualized Educational  Video(s) Outcome: Adequate for Discharge   Problem: Coping: Goal: Ability to adjust to condition or change in health will improve Outcome: Adequate for Discharge   Problem: Fluid Volume: Goal: Ability to maintain a balanced intake and output will improve Outcome: Adequate for Discharge   Problem: Health Behavior/Discharge Planning: Goal: Ability to identify and utilize available resources and services will improve Outcome: Adequate for Discharge Goal: Ability to manage health-related needs will improve Outcome: Adequate for Discharge   Problem: Metabolic: Goal: Ability to maintain appropriate glucose levels will improve Outcome: Adequate for Discharge   Problem: Nutritional: Goal: Maintenance of adequate nutrition will improve Outcome: Adequate for Discharge Goal: Progress toward achieving an optimal weight will improve Outcome: Adequate for Discharge   Problem: Skin Integrity: Goal: Risk for impaired skin integrity will decrease Outcome: Adequate for Discharge   Problem: Tissue Perfusion: Goal: Adequacy of tissue perfusion will improve Outcome: Adequate for Discharge   Problem: Fluid Volume: Goal: Hemodynamic stability will improve Outcome: Adequate for Discharge   Problem: Clinical Measurements: Goal: Diagnostic test results will improve Outcome: Adequate for Discharge Goal: Signs and symptoms of infection will decrease Outcome: Adequate for Discharge   Problem: Respiratory: Goal: Ability to maintain adequate ventilation will improve Outcome: Adequate for Discharge   Problem: Acute Rehab PT Goals(only PT should resolve) Goal: Pt Will Go Supine/Side To Sit Outcome: Adequate for Discharge Goal: Pt Will Go Sit To Supine/Side Outcome: Adequate for Discharge Goal: Pt Will Transfer Bed To Chair/Chair To Bed Outcome: Adequate for Discharge Goal: Pt Will Ambulate Outcome: Adequate for Discharge

## 2024-04-02 NOTE — Progress Notes (Signed)
 Mobility Specialist Progress Note;   04/02/24 0915  Mobility  Activity Ambulated with assistance in hallway  Level of Assistance Standby assist, set-up cues, supervision of patient - no hands on  Assistive Device None  Distance Ambulated (ft) 400 ft  Activity Response Tolerated well  Mobility Referral Yes  Mobility visit 1 Mobility  Mobility Specialist Start Time (ACUTE ONLY) 0915  Mobility Specialist Stop Time (ACUTE ONLY) [id number]  Mobility Specialist Time Calculation (min) (ACUTE ONLY) 7 min   Pt eager for mobility. Required no physical assistance during ambulation, SV. VSS throughout and no c/o when asked. Pt returned back to bed with all needs met. Family in room.  Janit Meline Mobility Specialist Please contact via SecureChat or [employer] [phone]

## 2024-04-02 NOTE — Telephone Encounter (Signed)
 Prescription refill request for Pradaxa received.  Indication:Afib  Last office visit: 03/20/24 Percell Boyers)  Weight: 84.4kg Age: 78 Scr: 0.99 (04/02/24)  CrCl: 74.25ml/min  Appropriate dose. Refill sent.

## 2024-04-02 NOTE — Discharge Summary (Signed)
 Physician Discharge Summary  Barnie Sopko WUJ:[id number] DOB: 10-26-1946 DOA: 03/31/2024  PCP: Richmond Campbell., PA-C  Admit date: 03/31/2024 Discharge date: 04/02/2024  Recommendations for Outpatient Follow-up:  Follow up with PCP   Discharge Condition: Stable CODE STATUS: Full code Diet recommendation: Heart healthy diet  Brief/Interim Summary: [name] is a 78 y.o. male with medical history significant of paroxysmal A-fib on Pradaxa, sick sinus syndrome status post PPM, type 2 diabetes, hypertension, hyperlipidemia generalized weakness/fatigue.  Patient states he was on Ozempic which was discontinued 2 weeks ago after losing weight and feeling lightheaded/weak.  He was switched to [place] instead.  He has a CGM and has not had any episodes of hypoglycemia.  He was recently diagnosed with Klebsiella oxytoca UTI on 4/12 at urgent care and started on Macrobid which he has been taking without much improvement.  Reports having chills.  His appetite has been poor.   Patient was admitted and started on empiric IV antibiotics, IV cefepime.  Urine culture as well as blood cultures were obtained.  Urine culture revealed drug-resistant Klebsiella.  He was switched over to Bactrim for discharge.  Lopressor dose was also decreased to 50 mg twice a daily due to hypotension and well-controlled heart rate.  He was seen by PT, OT and doing well and ordered for outpatient physical therapies.  On day of discharge, patient was feeling well and ready for discharge home.   Discharge Diagnoses:   Principal Problem:   Complicated UTI (urinary tract infection) Active Problems:   Paroxysmal atrial fibrillation (HCC)   Sick sinus syndrome (HCC)   Generalized weakness   Normocytic anemia   Hyponatremia Sepsis ruled out    Discharge Instructions  Discharge Instructions     Ambulatory referral to Occupational Therapy   Complete by: As directed    Ambulatory referral to Physical Therapy   Complete  by: As directed    Call MD for:  difficulty breathing, headache or visual disturbances   Complete by: As directed    Call MD for:  extreme fatigue   Complete by: As directed    Call MD for:  hives   Complete by: As directed    Call MD for:  persistant dizziness or light-headedness   Complete by: As directed    Call MD for:  persistant nausea and vomiting   Complete by: As directed    Call MD for:  severe uncontrolled pain   Complete by: As directed    Call MD for:  temperature >100.4   Complete by: As directed    Discharge instructions   Complete by: As directed    You were cared for by a hospitalist during your hospital stay. If you have any questions about your discharge medications or the care you received while you were in the hospital after you are discharged, you can call the unit and ask to speak with the hospitalist on call if the hospitalist that took care of you is not available. Once you are discharged, your primary care physician will handle any further medical issues. Please note that NO REFILLS for any discharge medications will be authorized once you are discharged, as it is imperative that you return to your primary care physician (or establish a relationship with a primary care physician if you do not have one) for your aftercare needs so that they can reassess your need for medications and monitor your lab values.   Increase activity slowly   Complete by: As directed  Allergies as of 04/02/2024       Reactions   Atorvastatin    myalgias        Medication List     STOP taking these medications    Boostrix 5-2.5-18.5 LF-MCG/0.5 injection Generic drug: Tdap   Comirnaty syringe Generic drug: COVID-19 mRNA vaccine (Pfizer)   nitrofurantoin (macrocrystal-monohydrate) 100 MG capsule Commonly known as: MACROBID       TAKE these medications    acetaminophen 650 MG CR tablet Commonly known as: TYLENOL Take 650 mg by mouth. 2 tabs bid   Coenzyme Q-10  200 MG Caps Take 1 capsule by mouth daily.   D3 Super Strength 50 MCG (2000 UT) Caps Generic drug: Cholecalciferol Take by mouth daily.   dabigatran 150 MG Caps capsule Commonly known as: PRADAXA TAKE 1 CAPSULE TWICE DAILY   Dexcom G6 Sensor Misc Inject into the skin. 1 sub-q every 10 days   empagliflozin 25 MG Tabs tablet Commonly known as: JARDIANCE Take 1 tablet by mouth daily.   flecainide 100 MG tablet Commonly known as: TAMBOCOR TAKE 1 TABLET TWICE DAILY   levocetirizine 5 MG tablet Commonly known as: XYZAL Take 1 tablet by mouth daily.   lisinopril 20 MG tablet Commonly known as: ZESTRIL Take 10 mg by mouth at bedtime.   Metamucil 28 % packet Generic drug: psyllium Take 1 packet by mouth daily.   metoprolol tartrate 50 MG tablet Commonly known as: LOPRESSOR Take 1 tablet (50 mg total) by mouth 2 (two) times daily. What changed:  medication strength how much to take   rosuvastatin 10 MG tablet Commonly known as: CRESTOR Take 10 mg by mouth daily.   sertraline 100 MG tablet Commonly known as: ZOLOFT Take 100 mg by mouth daily.   sulfamethoxazole-trimethoprim 800-160 MG tablet Commonly known as: Bactrim DS Take 1 tablet by mouth 2 (two) times daily for 10 days.        Follow-up Information     Richmond Campbell., PA-C Follow up.   Specialty: Family Medicine Contact information: [address] [place] [address] [phone]                Allergies  Allergen Reactions   Atorvastatin     myalgias     Procedures/Studies: CT CHEST ABDOMEN PELVIS W CONTRAST Result Date: 03/31/2024 CLINICAL DATA:  Sepsis.  UTI.  Hypotensive. EXAM: CT CHEST, ABDOMEN, AND PELVIS WITH CONTRAST TECHNIQUE: Multidetector CT imaging of the chest, abdomen and pelvis was performed following the standard protocol during bolus administration of intravenous contrast. RADIATION DOSE REDUCTION: This exam was performed according to the departmental  dose-optimization program which includes automated exposure control, adjustment of the mA and/or kV according to patient size and/or use of iterative reconstruction technique. CONTRAST:  OMNIPAQUE IOHEXOL 300 MG/ML  SOLN COMPARISON:  None Available. FINDINGS: CT CHEST FINDINGS Cardiovascular: Aorta is normal in size. Heart is mildly enlarged. There is no pericardial effusion pacemaker is present. There are atherosclerotic calcifications of the aorta. Mediastinum/Nodes: No enlarged mediastinal, hilar, or axillary lymph nodes. Thyroid gland, trachea, and esophagus demonstrate no significant findings. Lungs/Pleura: The lungs are clear. There is no pleural effusion or pneumothorax. Musculoskeletal: No chest wall mass or suspicious bone lesions identified. CT ABDOMEN PELVIS FINDINGS Hepatobiliary: No focal liver abnormality is seen. No gallstones, gallbladder wall thickening, or biliary dilatation. Pancreas: Unremarkable. No pancreatic ductal dilatation or surrounding inflammatory changes. Spleen: Normal in size without focal abnormality. Adrenals/Urinary Tract: Adrenal glands are unremarkable. Kidneys are normal, without  renal calculi, focal lesion, or hydronephrosis. Bladder is unremarkable. Stomach/Bowel: Stomach is within normal limits. Appendix appears normal. No evidence of bowel wall thickening, distention, or inflammatory changes. There is sigmoid colon diverticulosis. Vascular/Lymphatic: Aortic atherosclerosis. No enlarged abdominal or pelvic lymph nodes. Reproductive: Prostate gland is enlarged. Other: No abdominal wall hernia or abnormality. No abdominopelvic ascites. Musculoskeletal: No acute or significant osseous findings. IMPRESSION: 1. No acute localizing process in the chest, abdomen or pelvis. 2. Mild cardiomegaly. 3. Sigmoid colon diverticulosis. 4. Prostatomegaly. 5. Aortic atherosclerosis. Electronically Signed   By: Tyron Gallon M.D.   On: 03/31/2024 16:22   DG Chest Port 1 View Result  Date: 03/31/2024 CLINICAL DATA:  Weakness EXAM: PORTABLE CHEST 1 VIEW COMPARISON:  X-ray 02/28/2024. FINDINGS: No consolidation, pneumothorax or effusion. Normal cardiopericardial silhouette. No edema. Question some calcification along the left side of the pericardium. Left upper chest pacemaker with leads along the right side of the heart. IMPRESSION: Pacemaker. No acute cardiopulmonary disease. Question some calcification along the left side of the pericardium, unchanged. Electronically Signed   By: Adrianna Horde M.D.   On: 03/31/2024 11:26   CUP PACEART INCLINIC DEVICE CHECK Result Date: 03/20/2024 Normal in-clinic dual chamber pacemaker check. Presenting Rhythm: AP-VP. Routine testing of thresholds, sensing, and impedance demonstrate stable parameters and no programming changes needed at this time. No new episodes. Estimated longevity 74yr53mo . Pt enrolled in remote follow-up.      Discharge Exam: Vitals:   04/02/24 0319 04/02/24 0815  BP: 113/67 112/69  Pulse: 83 78  Resp: 18 20  Temp: 97.9 F (36.6 C) 97.9 F (36.6 C)  SpO2: 96%     General: Pt is alert, awake, not in acute distress Cardiovascular: RRR, S1/S2 +, no edema Respiratory: CTA bilaterally, no wheezing, no rhonchi, no respiratory distress, no conversational dyspnea  Abdominal: Soft, NT, ND, bowel sounds + Extremities: no edema, no cyanosis Psych: Normal mood and affect, stable judgement and insight     The results of significant diagnostics from this hospitalization (including imaging, microbiology, ancillary and laboratory) are listed below for reference.     Microbiology: Recent Results (from the past 240 hours)  Resp panel by RT-PCR (RSV, Flu A&B, Covid) Anterior Nasal Swab     Status: None   Collection Time: 03/31/24 10:18 AM   Specimen: Anterior Nasal Swab  Result Value Ref Range Status   SARS Coronavirus 2 by RT PCR NEGATIVE NEGATIVE Final    Comment: (NOTE) SARS-CoV-2 target nucleic acids are NOT  DETECTED.  The SARS-CoV-2 RNA is generally detectable in upper respiratory specimens during the acute phase of infection. The lowest concentration of SARS-CoV-2 viral copies this assay can detect is 138 copies/mL. A negative result does not preclude SARS-Cov-2 infection and should not be used as the sole basis for treatment or other patient management decisions. A negative result may occur with  improper specimen collection/handling, submission of specimen other than nasopharyngeal swab, presence of viral mutation(s) within the areas targeted by this assay, and inadequate number of viral copies(<138 copies/mL). A negative result must be combined with clinical observations, patient history, and epidemiological information. The expected result is Negative.  Fact Sheet for Patients:  BloggerCourse.com  Fact Sheet for Healthcare Providers:  SeriousBroker.it  This test is no t yet approved or cleared by the [place]  FDA and  has been authorized for detection and/or diagnosis of SARS-CoV-2 by FDA under an Emergency Use Authorization (EUA). This EUA will remain  in effect (meaning this test can be  used) for the duration of the COVID-19 declaration under Section 564(b)(1) of the Act, 21 U.S.C.section 360bbb-3(b)(1), unless the authorization is terminated  or revoked sooner.       Influenza A by PCR NEGATIVE NEGATIVE Final   Influenza B by PCR NEGATIVE NEGATIVE Final    Comment: (NOTE) The Xpert Xpress SARS-CoV-2/FLU/RSV plus assay is intended as an aid in the diagnosis of influenza from Nasopharyngeal swab specimens and should not be used as a sole basis for treatment. Nasal washings and aspirates are unacceptable for Xpert Xpress SARS-CoV-2/FLU/RSV testing.  Fact Sheet for Patients: BloggerCourse.com  Fact Sheet for Healthcare Providers: SeriousBroker.it  This test is not yet  approved or cleared by the [place] FDA and has been authorized for detection and/or diagnosis of SARS-CoV-2 by FDA under an Emergency Use Authorization (EUA). This EUA will remain in effect (meaning this test can be used) for the duration of the COVID-19 declaration under Section 564(b)(1) of the Act, 21 U.S.C. section 360bbb-3(b)(1), unless the authorization is terminated or revoked.     Resp Syncytial Virus by PCR NEGATIVE NEGATIVE Final    Comment: (NOTE) Fact Sheet for Patients: BloggerCourse.com  Fact Sheet for Healthcare Providers: SeriousBroker.it  This test is not yet approved or cleared by the [place] FDA and has been authorized for detection and/or diagnosis of SARS-CoV-2 by FDA under an Emergency Use Authorization (EUA). This EUA will remain in effect (meaning this test can be used) for the duration of the COVID-19 declaration under Section 564(b)(1) of the Act, 21 U.S.C. section 360bbb-3(b)(1), unless the authorization is terminated or revoked.  Performed at [facility], [address] [place], [place] [address]   Blood culture (routine x 2)     Status: None (Preliminary result)   Collection Time: 03/31/24 10:20 AM   Specimen: BLOOD  Result Value Ref Range Status   Specimen Description   Final    BLOOD BLOOD RIGHT FOREARM Performed at [facility], [address], [place], [place] [address]    Special Requests   Final    Blood Culture adequate volume BOTTLES DRAWN AEROBIC AND ANAEROBIC Performed at [facility], [address] [place], [place] [address]    Culture   Final    NO GROWTH 2 DAYS Performed at [facility], [address], [place], [place] [address]    Report Status PENDING  Incomplete  Urine Culture     Status: Abnormal   Collection Time: 03/31/24 11:34 AM   Specimen: Urine, Random  Result Value Ref Range Status    Specimen Description   Final    URINE, RANDOM Performed at [facility], [address], [place], [place] [address]    Special Requests   Final    NONE Reflexed from [id number] Performed at [facility], [address], [place], [place] [address]    Culture 40,000 COLONIES/mL KLEBSIELLA OXYTOCA (A)  Final   Report Status 04/02/2024 FINAL  Final   Organism ID, Bacteria KLEBSIELLA OXYTOCA (A)  Final      Susceptibility   Klebsiella oxytoca - MIC*    AMPICILLIN >=32 RESISTANT Resistant     CEFEPIME <=0.12 SENSITIVE Sensitive     CEFTRIAXONE <=0.25 SENSITIVE Sensitive     CIPROFLOXACIN <=0.25 SENSITIVE Sensitive     GENTAMICIN <=1 SENSITIVE Sensitive     IMIPENEM <=0.25 SENSITIVE Sensitive     NITROFURANTOIN 128 RESISTANT Resistant     TRIMETH/SULFA <=20 SENSITIVE Sensitive     AMPICILLIN/SULBACTAM >=  32 RESISTANT Resistant     PIP/TAZO 16 SENSITIVE Sensitive ug/mL    * 40,000 COLONIES/mL KLEBSIELLA OXYTOCA     Labs: BNP (last 3 results) No results for input(s): "BNP" in the last 8760 hours. Basic Metabolic Panel: Recent Labs  Lab 03/31/24 1018 04/01/24 0450 04/02/24 0426  NA 133* 134* 138  K 4.4 4.1 4.2  CL 101 105 111  CO2 19* 18* 20*  GLUCOSE 222* 115* 95  BUN 25* 21 15  CREATININE 1.20 1.25* 0.99  CALCIUM 9.4 8.4* 8.2*   Liver Function Tests: Recent Labs  Lab 03/31/24 1018  AST 14*  ALT 20  ALKPHOS 30*  BILITOT 0.5  PROT 7.1  ALBUMIN 3.8   No results for input(s): "LIPASE", "AMYLASE" in the last 168 hours. No results for input(s): "AMMONIA" in the last 168 hours. CBC: Recent Labs  Lab 03/31/24 1018 04/01/24 0450 04/02/24 0426  WBC 9.9 7.1 5.6  HGB 11.8* 10.0* 9.0*  HCT 33.4* 28.2* 25.8*  MCV 94.4 93.7 94.5  PLT 232 208 211   Cardiac Enzymes: Recent Labs  Lab 04/01/24 0450  CKTOTAL 31*   BNP: Invalid input(s): "POCBNP" CBG: Recent Labs  Lab 04/01/24 0753 04/01/24 1146 04/01/24 1622 04/01/24 2046  04/02/24 0818  GLUCAP 123* 159* 124* 111* 121*   D-Dimer Recent Labs    03/31/24 1018  DDIMER 0.50   Hgb A1c No results for input(s): "HGBA1C" in the last 72 hours. Lipid Profile No results for input(s): "CHOL", "HDL", "LDLCALC", "TRIG", "CHOLHDL", "LDLDIRECT" in the last 72 hours. Thyroid function studies Recent Labs    04/01/24 0450  TSH 1.226   Anemia work up No results for input(s): "VITAMINB12", "FOLATE", "FERRITIN", "TIBC", "IRON", "RETICCTPCT" in the last 72 hours. Urinalysis    Component Value Date/Time   COLORURINE YELLOW 03/31/2024 1134   APPEARANCEUR CLEAR 03/31/2024 1134   LABSPEC 1.022 03/31/2024 1134   PHURINE 5.0 03/31/2024 1134   GLUCOSEU >1,000 (A) 03/31/2024 1134   HGBUR NEGATIVE 03/31/2024 1134   BILIRUBINUR NEGATIVE 03/31/2024 1134   KETONESUR 15 (A) 03/31/2024 1134   PROTEINUR NEGATIVE 03/31/2024 1134   UROBILINOGEN 0.2 09/11/2011 1510   NITRITE NEGATIVE 03/31/2024 1134   LEUKOCYTESUR SMALL (A) 03/31/2024 1134   Sepsis Labs Recent Labs  Lab 03/31/24 1018 04/01/24 0450 04/02/24 0426  WBC 9.9 7.1 5.6   Microbiology Recent Results (from the past 240 hours)  Resp panel by RT-PCR (RSV, Flu A&B, Covid) Anterior Nasal Swab     Status: None   Collection Time: 03/31/24 10:18 AM   Specimen: Anterior Nasal Swab  Result Value Ref Range Status   SARS Coronavirus 2 by RT PCR NEGATIVE NEGATIVE Final    Comment: (NOTE) SARS-CoV-2 target nucleic acids are NOT DETECTED.  The SARS-CoV-2 RNA is generally detectable in upper respiratory specimens during the acute phase of infection. The lowest concentration of SARS-CoV-2 viral copies this assay can detect is 138 copies/mL. A negative result does not preclude SARS-Cov-2 infection and should not be used as the sole basis for treatment or other patient management decisions. A negative result may occur with  improper specimen collection/handling, submission of specimen other than nasopharyngeal swab,  presence of viral mutation(s) within the areas targeted by this assay, and inadequate number of viral copies(<138 copies/mL). A negative result must be combined with clinical observations, patient history, and epidemiological information. The expected result is Negative.  Fact Sheet for Patients:  BloggerCourse.com  Fact Sheet for Healthcare Providers:  SeriousBroker.it  This test is no  t yet approved or cleared by the [place] and  has been authorized for detection and/or diagnosis of SARS-CoV-2 by FDA under an Emergency Use Authorization (EUA). This EUA will remain  in effect (meaning this test can be used) for the duration of the COVID-19 declaration under Section 564(b)(1) of the Act, 21 U.S.C.section 360bbb-3(b)(1), unless the authorization is terminated  or revoked sooner.       Influenza A by PCR NEGATIVE NEGATIVE Final   Influenza B by PCR NEGATIVE NEGATIVE Final    Comment: (NOTE) The Xpert Xpress SARS-CoV-2/FLU/RSV plus assay is intended as an aid in the diagnosis of influenza from Nasopharyngeal swab specimens and should not be used as a sole basis for treatment. Nasal washings and aspirates are unacceptable for Xpert Xpress SARS-CoV-2/FLU/RSV testing.  Fact Sheet for Patients: BloggerCourse.com  Fact Sheet for Healthcare Providers: SeriousBroker.it  This test is not yet approved or cleared by the [place] FDA and has been authorized for detection and/or diagnosis of SARS-CoV-2 by FDA under an Emergency Use Authorization (EUA). This EUA will remain in effect (meaning this test can be used) for the duration of the COVID-19 declaration under Section 564(b)(1) of the Act, 21 U.S.C. section 360bbb-3(b)(1), unless the authorization is terminated or revoked.     Resp Syncytial Virus by PCR NEGATIVE NEGATIVE Final    Comment: (NOTE) Fact Sheet for  Patients: BloggerCourse.com  Fact Sheet for Healthcare Providers: SeriousBroker.it  This test is not yet approved or cleared by the [place] FDA and has been authorized for detection and/or diagnosis of SARS-CoV-2 by FDA under an Emergency Use Authorization (EUA). This EUA will remain in effect (meaning this test can be used) for the duration of the COVID-19 declaration under Section 564(b)(1) of the Act, 21 U.S.C. section 360bbb-3(b)(1), unless the authorization is terminated or revoked.  Performed at [facility], [address], [place], [place] [address]   Blood culture (routine x 2)     Status: None (Preliminary result)   Collection Time: 03/31/24 10:20 AM   Specimen: BLOOD  Result Value Ref Range Status   Specimen Description   Final    BLOOD BLOOD RIGHT FOREARM Performed at [facility], [address] [place], [place] [address]    Special Requests   Final    Blood Culture adequate volume BOTTLES DRAWN AEROBIC AND ANAEROBIC Performed at [facility], [address], [place], [place] [address]    Culture   Final    NO GROWTH 2 DAYS Performed at [facility], [address], [place], [place] [address]    Report Status PENDING  Incomplete  Urine Culture     Status: Abnormal   Collection Time: 03/31/24 11:34 AM   Specimen: Urine, Random  Result Value Ref Range Status   Specimen Description   Final    URINE, RANDOM Performed at [facility], [address] [place], [place] [address]    Special Requests   Final    NONE Reflexed from [id number] Performed at [facility], [address], [place], [place] [address]    Culture 40,000 COLONIES/mL KLEBSIELLA OXYTOCA (A)  Final   Report Status 04/02/2024 FINAL  Final   Organism ID, Bacteria KLEBSIELLA OXYTOCA (A)  Final      Susceptibility   Klebsiella oxytoca - MIC*     AMPICILLIN >=32 RESISTANT Resistant     CEFEPIME <=0.12 SENSITIVE Sensitive     CEFTRIAXONE <=0.25 SENSITIVE Sensitive  CIPROFLOXACIN <=0.25 SENSITIVE Sensitive     GENTAMICIN <=1 SENSITIVE Sensitive     IMIPENEM <=0.25 SENSITIVE Sensitive     NITROFURANTOIN 128 RESISTANT Resistant     TRIMETH/SULFA <=20 SENSITIVE Sensitive     AMPICILLIN/SULBACTAM >=32 RESISTANT Resistant     PIP/TAZO 16 SENSITIVE Sensitive ug/mL    * 40,000 COLONIES/mL KLEBSIELLA OXYTOCA     Patient was seen and examined on the day of discharge and was found to be in stable condition. Time coordinating discharge: 35 minutes including assessment and coordination of care, as well as examination of the patient.   SIGNED:  Daren Eck, DO [facility] 04/02/2024, 1:25 PM

## 2024-04-02 NOTE — TOC Transition Note (Signed)
 Transition of Care [facility]) - Discharge Note   Patient Details  Name: [name] MRN: [id number] Date of Birth: 11/08/1946  [facility] CM/SW Contact:  Jeani Mill, RN Phone Number: 04/02/2024, 1:16 PM   Clinical Narrative:    Patient stable for discharge.  [facility]erral [facility] for PT, OT.  Family will transport home.     Final next level of care: OP Rehab Barriers to Discharge: Barriers Resolved   Patient Goals and CMS Choice         Return home   Discharge Placement               [facility] [place]        Discharge Plan and Services Additional resources added to the After Visit Summary for                                       Social Drivers of Health (SDOH) Interventions SDOH Screenings   Food Insecurity: No Food Insecurity (04/01/2024)  Housing: Low Risk  (04/01/2024)  Transportation Needs: No Transportation Needs (04/01/2024)  Utilities: Not At Risk (04/01/2024)  Financial Resource Strain: Low Risk  (10/19/2021)   Received from [facility] visits prior to 02/16/2023., [facility] visits prior to 02/16/2023.  Physical Activity: Insufficiently Active (10/19/2021)   Received from [facility] visits prior to 02/16/2023., [facility] visits prior to 02/16/2023.  Social Connections: Socially Integrated (04/01/2024)  Stress: No Stress Concern Present (10/19/2021)   [facility] from [facility] visits prior to 02/16/2023., [facility] visits prior to 02/16/2023.  Tobacco Use: Low Risk  (03/31/2024)     Readmission Risk Interventions     No data to display

## 2024-04-02 NOTE — Plan of Care (Signed)
  Problem: Health Behavior/Discharge Planning: Goal: Ability to manage health-related needs will improve Outcome: Progressing   Problem: Clinical Measurements: Goal: Respiratory complications will improve Outcome: Progressing Goal: Cardiovascular complication will be avoided Outcome: Progressing   Problem: Nutrition: Goal: Adequate nutrition will be maintained Outcome: Progressing   Problem: Safety: Goal: Ability to remain free from injury will improve Outcome: Progressing   Problem: Fluid Volume: Goal: Hemodynamic stability will improve Outcome: Progressing   Problem: Clinical Measurements: Goal: Signs and symptoms of infection will decrease Outcome: Progressing   Problem: Respiratory: Goal: Ability to maintain adequate ventilation will improve Outcome: Progressing

## 2024-04-03 ENCOUNTER — Ambulatory Visit (INDEPENDENT_AMBULATORY_CARE_PROVIDER_SITE_OTHER)

## 2024-04-03 ENCOUNTER — Ambulatory Visit: Payer: Medicare PPO

## 2024-04-03 DIAGNOSIS — I495 Sick sinus syndrome: Secondary | ICD-10-CM | POA: Diagnosis not present

## 2024-04-05 LAB — CULTURE, BLOOD (ROUTINE X 2)
Culture: NO GROWTH
Special Requests: ADEQUATE

## 2024-04-06 ENCOUNTER — Ambulatory Visit: Payer: Medicare PPO

## 2024-04-06 LAB — CUP PACEART REMOTE DEVICE CHECK
Battery Impedance: 3540 Ohm
Battery Remaining Longevity: 14 mo
Battery Voltage: 2.69 V
Brady Statistic AP VP Percent: 100 %
Brady Statistic AP VS Percent: 0 %
Brady Statistic AS VP Percent: 0 %
Brady Statistic AS VS Percent: 0 %
Date Time Interrogation Session: 20250418152014
Implantable Lead Connection Status: 753985
Implantable Lead Connection Status: 753985
Implantable Lead Implant Date: 20160303
Implantable Lead Implant Date: 20160303
Implantable Lead Location: 753859
Implantable Lead Location: 753860
Implantable Lead Model: 5076
Implantable Lead Model: 5092
Implantable Pulse Generator Implant Date: 20160303
Lead Channel Impedance Value: 480 Ohm
Lead Channel Impedance Value: 580 Ohm
Lead Channel Pacing Threshold Amplitude: 0.625 V
Lead Channel Pacing Threshold Amplitude: 1 V
Lead Channel Pacing Threshold Pulse Width: 0.4 ms
Lead Channel Pacing Threshold Pulse Width: 0.4 ms
Lead Channel Setting Pacing Amplitude: 2 V
Lead Channel Setting Pacing Amplitude: 2.5 V
Lead Channel Setting Pacing Pulse Width: 0.4 ms
Lead Channel Setting Sensing Sensitivity: 2 mV
Zone Setting Status: 755011
Zone Setting Status: 755011

## 2024-04-07 ENCOUNTER — Encounter: Payer: Self-pay | Admitting: Cardiology

## 2024-04-14 ENCOUNTER — Encounter: Payer: Self-pay | Admitting: Internal Medicine

## 2024-04-14 ENCOUNTER — Ambulatory Visit: Payer: Self-pay | Admitting: Internal Medicine

## 2024-04-14 VITALS — BP 108/70 | HR 88 | Temp 97.6°F | Resp 21 | Ht 74.0 in | Wt 184.4 lb

## 2024-04-14 DIAGNOSIS — I495 Sick sinus syndrome: Secondary | ICD-10-CM

## 2024-04-14 DIAGNOSIS — E782 Mixed hyperlipidemia: Secondary | ICD-10-CM

## 2024-04-14 DIAGNOSIS — R531 Weakness: Secondary | ICD-10-CM

## 2024-04-14 DIAGNOSIS — E1165 Type 2 diabetes mellitus with hyperglycemia: Secondary | ICD-10-CM

## 2024-04-14 DIAGNOSIS — D649 Anemia, unspecified: Secondary | ICD-10-CM

## 2024-04-14 DIAGNOSIS — N390 Urinary tract infection, site not specified: Secondary | ICD-10-CM

## 2024-04-14 DIAGNOSIS — F3342 Major depressive disorder, recurrent, in full remission: Secondary | ICD-10-CM

## 2024-04-14 DIAGNOSIS — I480 Paroxysmal atrial fibrillation: Secondary | ICD-10-CM

## 2024-04-14 NOTE — Progress Notes (Signed)
 Location:   [facility]   Place of Service:   Clinic  Provider:   Code Status:  Goals of Care:     04/14/2024    8:16 AM  Advanced Directives  Does Patient Have a Medical Advance Directive? Yes  Type of Estate agent of [place];Living will;Out of facility DNR (pink MOST or yellow form)  Copy of Healthcare Power of Attorney in Chart? No - copy requested     Chief Complaint  Patient presents with   New Patient (Initial Visit)    HPI: Patient is a 78 y.o. male seen today for medical management of chronic diseases.    Patient moved to [facility] to be close to their Daughter  Recent Admission to the hospital 04/15-04/17 for UTI with Sepsis  Patient has h/o Diabetes Type 2  Was on Ozempoic which caused him to get weak and Loose weight He switched back to Jardiance  and doing well now  UTI with sepsis Grew Klebsiella Finished Bactrim  for 12 days Not having any symptoms now.   PAF On Flecainide  and Pradaxa  Follows with Dr Marven Slimmer  S/p PPM for SSS Hypertension recently dose of Lopressor  was reduced  Has recovered now Still feels weak Worked with therapy Now doing some work in [employer] No more dizziness or falls Walks with no assist Still drives  Past Medical History:  Diagnosis Date   Arthritis    Diabetes mellitus    exercise and diet controlled   Encounter for care of pacemaker    Hypertension    Paroxysmal atrial fibrillation (HCC)    Sick sinus syndrome [facility])     Past Surgical History:  Procedure Laterality Date   JOINT REPLACEMENT  09/2011   left total knee   JOINT REPLACEMENT  06/2006   right total knee   KNEE CLOSED REDUCTION  12/05/2011   Procedure: CLOSED MANIPULATION KNEE;  Surgeon: Aurther Blue;  Location: [facility];  Service: Orthopedics;  Laterality: Left;   PACEMAKER INSERTION  02/17/2015   MDT Adapta implanted by Dr Bernestine Brighter in [place]  for sick sinus syndrome    Allergies  Allergen Reactions   Atorvastatin      myalgias    Outpatient Encounter Medications as of 04/14/2024  Medication Sig   acetaminophen  (TYLENOL ) 650 MG CR tablet Take 650 mg by mouth. 2 tabs bid   Cholecalciferol (D3 SUPER STRENGTH) 50 MCG (2000 UT) CAPS Take by mouth daily.   Coenzyme Q-10 200 MG CAPS Take 1 capsule by mouth daily.   Continuous Glucose Sensor (DEXCOM G6 SENSOR) MISC Inject into the skin. 1 sub-q every 10 days   dabigatran  (PRADAXA ) 150 MG CAPS capsule TAKE 1 CAPSULE BY MOUTH TWICE A DAY   empagliflozin  (JARDIANCE ) 25 MG TABS tablet Take 1 tablet by mouth daily.   flecainide  (TAMBOCOR ) 100 MG tablet TAKE 1 TABLET TWICE DAILY   levocetirizine (XYZAL) 5 MG tablet Take 1 tablet by mouth daily.   lisinopril (PRINIVIL,ZESTRIL) 20 MG tablet Take 10 mg by mouth at bedtime.   metoprolol  tartrate (LOPRESSOR ) 50 MG tablet Take 1 tablet (50 mg total) by mouth 2 (two) times daily.   psyllium (METAMUCIL) 28 % packet Take 1 packet by mouth daily.   rosuvastatin (CRESTOR) 10 MG tablet Take 10 mg by mouth daily.   sertraline  (ZOLOFT ) 100 MG tablet Take 100 mg by mouth daily.   No facility-administered encounter medications on file as of 04/14/2024.    Review of Systems:  Review of Systems  Constitutional:  Positive for unexpected weight change. Negative for activity change and appetite change.  HENT: Negative.    Respiratory:  Negative for cough and shortness of breath.   Cardiovascular:  Negative for leg swelling.  Gastrointestinal:  Negative for constipation.  Genitourinary:  Negative for frequency.  Musculoskeletal:  Negative for arthralgias, gait problem and myalgias.  Skin: Negative.  Negative for rash.  Neurological:  Positive for weakness. Negative for dizziness.  Psychiatric/Behavioral:  Negative for confusion and sleep disturbance.   All other systems reviewed and are negative.   Health Maintenance  Topic Date Due   Hepatitis C Screening  Never done   Pneumonia Vaccine 15+ Years old (1 of 2 - PCV) Never done    COVID-19 Vaccine (7 - Moderna risk 2024-25 season) 10/16/2024 (Originally 03/19/2024)   INFLUENZA VACCINE  07/17/2024   Medicare Annual Wellness (AWV)  11/19/2024   DTaP/Tdap/Td (2 - Td or Tdap) 12/25/2032   Zoster Vaccines- Shingrix  Completed   HPV VACCINES  Aged Out   Meningococcal B Vaccine  Aged Out    Physical Exam: Vitals:   04/14/24 0807  BP: 108/70  Pulse: 88  Resp: (!) 21  Temp: 97.6 F (36.4 C)  SpO2: 97%  Weight: 184 lb 6.4 oz (83.6 kg)  Height: 6\' 2"  (1.88 m)   Body mass index is 23.68 kg/m. Physical Exam Vitals reviewed.  Constitutional:      Appearance: Normal appearance.  HENT:     Head: Normocephalic.     Right Ear: Tympanic membrane normal.     Left Ear: Tympanic membrane normal.     Nose: Nose normal.     Mouth/Throat:     Mouth: Mucous membranes are moist.     Pharynx: Oropharynx is clear.  Eyes:     Pupils: Pupils are equal, round, and reactive to light.  Cardiovascular:     Rate and Rhythm: Normal rate and regular rhythm.     Pulses: Normal pulses.     Heart sounds: No murmur heard. Pulmonary:     Effort: Pulmonary effort is normal. No respiratory distress.     Breath sounds: Normal breath sounds. No rales.  Abdominal:     General: Abdomen is flat. Bowel sounds are normal.     Palpations: Abdomen is soft.  Musculoskeletal:        General: No swelling.     Cervical back: Neck supple.  Skin:    General: Skin is warm.  Neurological:     General: No focal deficit present.     Mental Status: He is alert and oriented to person, place, and time.  Psychiatric:        Mood and Affect: Mood normal.        Thought Content: Thought content normal.     Labs reviewed: Basic Metabolic Panel: Recent Labs    03/31/24 1018 04/01/24 0450 04/02/24 0426  NA 133* 134* 138  K 4.4 4.1 4.2  CL 101 105 111  CO2 19* 18* 20*  GLUCOSE 222* 115* 95  BUN 25* 21 15  CREATININE 1.20 1.25* 0.99  CALCIUM 9.4 8.4* 8.2*  TSH  --  1.226  --    Liver  Function Tests: Recent Labs    02/28/24 1629 03/31/24 1018  AST 16 14*  ALT 18 20  ALKPHOS 26* 30*  BILITOT 0.8 0.5  PROT 5.9* 7.1  ALBUMIN 3.4* 3.8   No results for input(s): "LIPASE", "AMYLASE" in the last 8760 hours. No results for input(s): "AMMONIA" in  the last 8760 hours. CBC: Recent Labs    02/28/24 1629 03/31/24 1018 04/01/24 0450 04/02/24 0426  WBC 8.8 9.9 7.1 5.6  NEUTROABS 7.2  --   --   --   HGB 12.4* 11.8* 10.0* 9.0*  HCT 35.9* 33.4* 28.2* 25.8*  MCV 95.5 94.4 93.7 94.5  PLT 136* 232 208 211   Lipid Panel: No results for input(s): "CHOL", "HDL", "LDLCALC", "TRIG", "CHOLHDL", "LDLDIRECT" in the last 8760 hours. No results found for: "HGBA1C"  Procedures since last visit: CUP PACEART REMOTE DEVICE CHECK Result Date: 04/06/2024 Monthly battery check.  Estimated 14 months, min <2 months remaining longevity. Normal device function. No new alerts. Follow up as scheduled monthly - CS, CVRS  CT CHEST ABDOMEN PELVIS W CONTRAST Result Date: 03/31/2024 CLINICAL DATA:  Sepsis.  UTI.  Hypotensive. EXAM: CT CHEST, ABDOMEN, AND PELVIS WITH CONTRAST TECHNIQUE: Multidetector CT imaging of the chest, abdomen and pelvis was performed following the standard protocol during bolus administration of intravenous contrast. RADIATION DOSE REDUCTION: This exam was performed according to the departmental dose-optimization program which includes automated exposure control, adjustment of the mA and/or kV according to patient size and/or use of iterative reconstruction technique. CONTRAST:  OMNIPAQUE  IOHEXOL  300 MG/ML  SOLN COMPARISON:  None Available. FINDINGS: CT CHEST FINDINGS Cardiovascular: Aorta is normal in size. Heart is mildly enlarged. There is no pericardial effusion pacemaker is present. There are atherosclerotic calcifications of the aorta. Mediastinum/Nodes: No enlarged mediastinal, hilar, or axillary lymph nodes. Thyroid  gland, trachea, and esophagus demonstrate no significant  findings. Lungs/Pleura: The lungs are clear. There is no pleural effusion or pneumothorax. Musculoskeletal: No chest wall mass or suspicious bone lesions identified. CT ABDOMEN PELVIS FINDINGS Hepatobiliary: No focal liver abnormality is seen. No gallstones, gallbladder wall thickening, or biliary dilatation. Pancreas: Unremarkable. No pancreatic ductal dilatation or surrounding inflammatory changes. Spleen: Normal in size without focal abnormality. Adrenals/Urinary Tract: Adrenal glands are unremarkable. Kidneys are normal, without renal calculi, focal lesion, or hydronephrosis. Bladder is unremarkable. Stomach/Bowel: Stomach is within normal limits. Appendix appears normal. No evidence of bowel wall thickening, distention, or inflammatory changes. There is sigmoid colon diverticulosis. Vascular/Lymphatic: Aortic atherosclerosis. No enlarged abdominal or pelvic lymph nodes. Reproductive: Prostate gland is enlarged. Other: No abdominal wall hernia or abnormality. No abdominopelvic ascites. Musculoskeletal: No acute or significant osseous findings. IMPRESSION: 1. No acute localizing process in the chest, abdomen or pelvis. 2. Mild cardiomegaly. 3. Sigmoid colon diverticulosis. 4. Prostatomegaly. 5. Aortic atherosclerosis. Electronically Signed   By: Tyron Gallon M.D.   On: 03/31/2024 16:22   DG Chest Port 1 View Result Date: 03/31/2024 CLINICAL DATA:  Weakness EXAM: PORTABLE CHEST 1 VIEW COMPARISON:  X-ray 02/28/2024. FINDINGS: No consolidation, pneumothorax or effusion. Normal cardiopericardial silhouette. No edema. Question some calcification along the left side of the pericardium. Left upper chest pacemaker with leads along the right side of the heart. IMPRESSION: Pacemaker. No acute cardiopulmonary disease. Question some calcification along the left side of the pericardium, unchanged. Electronically Signed   By: Adrianna Horde M.D.   On: 03/31/2024 11:26   CUP PACEART INCLINIC DEVICE CHECK Result Date:  03/20/2024 Normal in-clinic dual chamber pacemaker check. Presenting Rhythm: AP-VP. Routine testing of thresholds, sensing, and impedance demonstrate stable parameters and no programming changes needed at this time. No new episodes. Estimated longevity 77yr71mo . Pt enrolled in remote follow-up.   Assessment/Plan 1. Complicated UTI (urinary tract infection) (Primary) Admitted in the hospital with sepsis Was discharged on Bactrim  Doing better now If Symptoms  return will need to see urology CT in the hospital showed Normal Kidneys  2. Sick sinus syndrome (HCC) S/P PPM follow with Cardiology  3. Mixed hyperlipidemia Statin LDL 61   4. Generalized weakness Still working with therapy and doing much better  5. Anemia, unspecified type Seen in the hospital His HGB in the past has been normal Will Repeat Labs in few months  6. Paroxysmal atrial fibrillation (HCC) On Metoprolol  , Tambocor  and Pradaxa  No signs of bleeding  7 Type 2 diabetes Last A1c was 6.8 Upto date on Eye exam Will need repeat Labs in July 8 HLD On statin 9 Depression On Zoloft  10 HTN On Lisinopril and Metoprolol   Labs/tests ordered:  Labs before the next visit Next appt:  Visit date not found

## 2024-04-24 DIAGNOSIS — R300 Dysuria: Secondary | ICD-10-CM | POA: Diagnosis not present

## 2024-04-26 ENCOUNTER — Encounter: Payer: Self-pay | Admitting: Internal Medicine

## 2024-04-28 ENCOUNTER — Encounter: Payer: Self-pay | Admitting: Internal Medicine

## 2024-04-28 ENCOUNTER — Ambulatory Visit: Admitting: Internal Medicine

## 2024-04-28 VITALS — BP 90/74 | HR 88 | Temp 97.0°F | Resp 21 | Ht 74.0 in | Wt 188.4 lb

## 2024-04-28 DIAGNOSIS — I959 Hypotension, unspecified: Secondary | ICD-10-CM

## 2024-04-28 DIAGNOSIS — M545 Low back pain, unspecified: Secondary | ICD-10-CM | POA: Diagnosis not present

## 2024-04-28 DIAGNOSIS — R531 Weakness: Secondary | ICD-10-CM

## 2024-04-28 DIAGNOSIS — G8929 Other chronic pain: Secondary | ICD-10-CM

## 2024-04-28 DIAGNOSIS — E1165 Type 2 diabetes mellitus with hyperglycemia: Secondary | ICD-10-CM | POA: Diagnosis not present

## 2024-04-28 MED ORDER — EMPAGLIFLOZIN 10 MG PO TABS: 10.0000 mg | ORAL_TABLET | Freq: Every day | ORAL | 0 refills | Status: DC

## 2024-04-28 NOTE — Progress Notes (Unsigned)
 Location:  [facility]   Place of Service:   Clinic  Provider:   Code Status:  Goals of Care:     04/14/2024    8:16 AM  Advanced Directives  Does Patient Have a Medical Advance Directive? Yes  Type of Estate agent of [place];Living will;Out of facility DNR (pink MOST or yellow form)  Copy of Healthcare Power of Attorney in Chart? No - copy requested     Chief Complaint  Patient presents with   low blood pressure    HPI: Patient is a 78 y.o. male seen today for an acute visit for low Blood Pressure and feeling weak  Recent Move to [place]   Recent Admission to the hospital 04/15-04/17 for UTI with Sepsis   Patient has h/o Diabetes Type 2  On Jardiance  was on 10 mg but now on 25 mg UA has more then 1000 mg of sugars Patient says that he is drinking a lot of water Failed Ozempic and Metformin due to Side effects Hypotension BP running low even at home SBP running in 80s No Nausea Vomiting Fever Dysuria No Diarrhea Feels weak Some dizziness when stands up No Falls  Low back pain Notices more when he gets up in the morning No  PAF On Flecainide  and Pradaxa  Follows with Dr Marven Slimmer   S/p PPM for SSS Hypertension recently dose of Lopressor  was reduced    Past Medical History:  Diagnosis Date   Arthritis    Diabetes mellitus    exercise and diet controlled   Encounter for care of pacemaker    Hypertension    Paroxysmal atrial fibrillation (HCC)    Sick sinus syndrome [facility])     Past Surgical History:  Procedure Laterality Date   JOINT REPLACEMENT  09/2011   left total knee   JOINT REPLACEMENT  06/2006   right total knee   KNEE CLOSED REDUCTION  12/05/2011   Procedure: CLOSED MANIPULATION KNEE;  Surgeon: Aurther Blue;  Location: [facility];  Service: Orthopedics;  Laterality: Left;   PACEMAKER INSERTION  02/17/2015   MDT Adapta implanted by Dr Bernestine Brighter in [place]  for sick sinus syndrome    Allergies  Allergen Reactions    Atorvastatin     myalgias    Outpatient Encounter Medications as of 04/28/2024  Medication Sig   acetaminophen  (TYLENOL ) 650 MG CR tablet Take 650 mg by mouth. 2 tabs bid   Cholecalciferol (D3 SUPER STRENGTH) 50 MCG (2000 UT) CAPS Take by mouth daily.   Coenzyme Q-10 200 MG CAPS Take 1 capsule by mouth daily.   Continuous Glucose Sensor (DEXCOM G6 SENSOR) MISC Inject into the skin. 1 sub-q every 10 days   dabigatran  (PRADAXA ) 150 MG CAPS capsule TAKE 1 CAPSULE BY MOUTH TWICE A DAY   empagliflozin  (JARDIANCE ) 10 MG TABS tablet Take 1 tablet (10 mg total) by mouth daily before breakfast.   flecainide  (TAMBOCOR ) 100 MG tablet TAKE 1 TABLET TWICE DAILY   levocetirizine (XYZAL) 5 MG tablet Take 1 tablet by mouth daily.   metoprolol  tartrate (LOPRESSOR ) 50 MG tablet Take 1 tablet (50 mg total) by mouth 2 (two) times daily.   psyllium (METAMUCIL) 28 % packet Take 1 packet by mouth daily.   rosuvastatin (CRESTOR) 10 MG tablet Take 10 mg by mouth daily.   sertraline  (ZOLOFT ) 100 MG tablet Take 100 mg by mouth daily.   [DISCONTINUED] empagliflozin  (JARDIANCE ) 25 MG TABS tablet Take 1 tablet by mouth daily.   [DISCONTINUED] lisinopril (PRINIVIL,ZESTRIL)  20 MG tablet Take 10 mg by mouth at bedtime.   No facility-administered encounter medications on file as of 04/28/2024.    Review of Systems:  Review of Systems  Constitutional:  Positive for activity change. Negative for appetite change and unexpected weight change.  HENT: Negative.    Respiratory:  Negative for cough and shortness of breath.   Cardiovascular:  Negative for leg swelling.  Gastrointestinal:  Negative for constipation.  Genitourinary:  Negative for frequency.  Musculoskeletal:  Negative for arthralgias, gait problem and myalgias.  Skin: Negative.  Negative for rash.  Neurological:  Positive for dizziness and weakness.  Psychiatric/Behavioral:  Negative for confusion and sleep disturbance.   All other systems reviewed and are  negative.   Health Maintenance  Topic Date Due   Diabetic kidney evaluation - Urine ACR  Never done   Hepatitis C Screening  Never done   Pneumonia Vaccine 30+ Years old (1 of 2 - PCV) Never done   COVID-19 Vaccine (7 - Moderna risk 2024-25 season) 10/16/2024 (Originally 03/19/2024)   INFLUENZA VACCINE  07/17/2024   Medicare Annual Wellness (AWV)  11/19/2024   Diabetic kidney evaluation - eGFR measurement  04/02/2025   DTaP/Tdap/Td (2 - Td or Tdap) 12/25/2032   Zoster Vaccines- Shingrix  Completed   HPV VACCINES  Aged Out   Meningococcal B Vaccine  Aged Out    Physical Exam: Vitals:   04/28/24 0846 04/28/24 0853  BP: (!) 80/60 90/74  Pulse: 88   Resp: (!) 21   Temp: (!) 97 F (36.1 C)   SpO2: 96%   Weight: 188 lb 6.4 oz (85.5 kg)   Height: 6\' 2"  (1.88 m)    Body mass index is 24.19 kg/m. Physical Exam Vitals reviewed.  Constitutional:      Appearance: Normal appearance.  HENT:     Head: Normocephalic.     Nose: Nose normal.     Mouth/Throat:     Mouth: Mucous membranes are moist.     Pharynx: Oropharynx is clear.  Eyes:     Pupils: Pupils are equal, round, and reactive to light.  Cardiovascular:     Rate and Rhythm: Normal rate and regular rhythm.     Pulses: Normal pulses.     Heart sounds: No murmur heard. Pulmonary:     Effort: Pulmonary effort is normal. No respiratory distress.     Breath sounds: Normal breath sounds. No rales.  Abdominal:     General: Abdomen is flat. Bowel sounds are normal.     Palpations: Abdomen is soft.  Musculoskeletal:        General: No swelling.     Cervical back: Neck supple.  Skin:    General: Skin is warm.  Neurological:     General: No focal deficit present.     Mental Status: He is alert and oriented to person, place, and time.  Psychiatric:        Mood and Affect: Mood normal.        Thought Content: Thought content normal.     Labs reviewed: Basic Metabolic Panel: Recent Labs    03/31/24 1018 04/01/24 0450  04/02/24 0426  NA 133* 134* 138  K 4.4 4.1 4.2  CL 101 105 111  CO2 19* 18* 20*  GLUCOSE 222* 115* 95  BUN 25* 21 15  CREATININE 1.20 1.25* 0.99  CALCIUM 9.4 8.4* 8.2*  TSH  --  1.226  --    Liver Function Tests: Recent Labs  02/28/24 1629 03/31/24 1018  AST 16 14*  ALT 18 20  ALKPHOS 26* 30*  BILITOT 0.8 0.5  PROT 5.9* 7.1  ALBUMIN 3.4* 3.8   No results for input(s): "LIPASE", "AMYLASE" in the last 8760 hours. No results for input(s): "AMMONIA" in the last 8760 hours. CBC: Recent Labs    02/28/24 1629 03/31/24 1018 04/01/24 0450 04/02/24 0426  WBC 8.8 9.9 7.1 5.6  NEUTROABS 7.2  --   --   --   HGB 12.4* 11.8* 10.0* 9.0*  HCT 35.9* 33.4* 28.2* 25.8*  MCV 95.5 94.4 93.7 94.5  PLT 136* 232 208 211   Lipid Panel: No results for input(s): "CHOL", "HDL", "LDLCALC", "TRIG", "CHOLHDL", "LDLDIRECT" in the last 8760 hours. No results found for: "HGBA1C"  Procedures since last visit: CUP PACEART REMOTE DEVICE CHECK Result Date: 04/06/2024 Monthly battery check.  Estimated 14 months, min <2 months remaining longevity. Normal device function. No new alerts. Follow up as scheduled monthly - CS, CVRS  CT CHEST ABDOMEN PELVIS W CONTRAST Result Date: 03/31/2024 CLINICAL DATA:  Sepsis.  UTI.  Hypotensive. EXAM: CT CHEST, ABDOMEN, AND PELVIS WITH CONTRAST TECHNIQUE: Multidetector CT imaging of the chest, abdomen and pelvis was performed following the standard protocol during bolus administration of intravenous contrast. RADIATION DOSE REDUCTION: This exam was performed according to the departmental dose-optimization program which includes automated exposure control, adjustment of the mA and/or kV according to patient size and/or use of iterative reconstruction technique. CONTRAST:  OMNIPAQUE  IOHEXOL  300 MG/ML  SOLN COMPARISON:  None Available. FINDINGS: CT CHEST FINDINGS Cardiovascular: Aorta is normal in size. Heart is mildly enlarged. There is no pericardial effusion  pacemaker is present. There are atherosclerotic calcifications of the aorta. Mediastinum/Nodes: No enlarged mediastinal, hilar, or axillary lymph nodes. Thyroid  gland, trachea, and esophagus demonstrate no significant findings. Lungs/Pleura: The lungs are clear. There is no pleural effusion or pneumothorax. Musculoskeletal: No chest wall mass or suspicious bone lesions identified. CT ABDOMEN PELVIS FINDINGS Hepatobiliary: No focal liver abnormality is seen. No gallstones, gallbladder wall thickening, or biliary dilatation. Pancreas: Unremarkable. No pancreatic ductal dilatation or surrounding inflammatory changes. Spleen: Normal in size without focal abnormality. Adrenals/Urinary Tract: Adrenal glands are unremarkable. Kidneys are normal, without renal calculi, focal lesion, or hydronephrosis. Bladder is unremarkable. Stomach/Bowel: Stomach is within normal limits. Appendix appears normal. No evidence of bowel wall thickening, distention, or inflammatory changes. There is sigmoid colon diverticulosis. Vascular/Lymphatic: Aortic atherosclerosis. No enlarged abdominal or pelvic lymph nodes. Reproductive: Prostate gland is enlarged. Other: No abdominal wall hernia or abnormality. No abdominopelvic ascites. Musculoskeletal: No acute or significant osseous findings. IMPRESSION: 1. No acute localizing process in the chest, abdomen or pelvis. 2. Mild cardiomegaly. 3. Sigmoid colon diverticulosis. 4. Prostatomegaly. 5. Aortic atherosclerosis. Electronically Signed   By: Tyron Gallon M.D.   On: 03/31/2024 16:22   DG Chest Port 1 View Result Date: 03/31/2024 CLINICAL DATA:  Weakness EXAM: PORTABLE CHEST 1 VIEW COMPARISON:  X-ray 02/28/2024. FINDINGS: No consolidation, pneumothorax or effusion. Normal cardiopericardial silhouette. No edema. Question some calcification along the left side of the pericardium. Left upper chest pacemaker with leads along the right side of the heart. IMPRESSION: Pacemaker. No acute  cardiopulmonary disease. Question some calcification along the left side of the pericardium, unchanged. Electronically Signed   By: Adrianna Horde M.D.   On: 03/31/2024 11:26    Assessment/Plan 1. Hypotension, unspecified hypotension type (Primary) Discontinue Lisinopril Ordered Labs CBC,CMP  Change Jardiance  to 10 mg Continue to hydrate  2. Type  2 diabetes mellitus with hyperglycemia, without long-term current use of insulin  (HCC) Change Jardiance  to 10 mg  3. Generalized weakness ***  4. Chronic bilateral low back pain without sciatica ***    Labs/tests ordered:  * No order type specified * Next appt:  05/12/2024

## 2024-04-28 NOTE — Patient Instructions (Signed)
 Stop Lisinopril Lower the Jardiance  to 10 mg QD

## 2024-04-29 NOTE — Telephone Encounter (Signed)
 Doing B 12 levels Patient is aware

## 2024-04-30 DIAGNOSIS — E785 Hyperlipidemia, unspecified: Secondary | ICD-10-CM | POA: Diagnosis not present

## 2024-04-30 DIAGNOSIS — I10 Essential (primary) hypertension: Secondary | ICD-10-CM | POA: Diagnosis not present

## 2024-04-30 DIAGNOSIS — E559 Vitamin D deficiency, unspecified: Secondary | ICD-10-CM | POA: Diagnosis not present

## 2024-04-30 DIAGNOSIS — E119 Type 2 diabetes mellitus without complications: Secondary | ICD-10-CM | POA: Diagnosis not present

## 2024-04-30 LAB — BASIC METABOLIC PANEL WITH GFR
BUN: 18 (ref 4–21)
CO2: 20 (ref 13–22)
Chloride: 101 (ref 99–108)
Creatinine: 0.9 (ref 0.6–1.3)
Glucose: 117
Potassium: 4.4 meq/L (ref 3.5–5.1)
Sodium: 134 — AB (ref 137–147)

## 2024-04-30 LAB — HEMOGLOBIN A1C: Hemoglobin A1C: 5.7

## 2024-04-30 LAB — COMPREHENSIVE METABOLIC PANEL WITH GFR
Albumin: 4 (ref 3.5–5.0)
Calcium: 9.2 (ref 8.7–10.7)
Globulin: 2.2
eGFR: 84

## 2024-04-30 LAB — CBC AND DIFFERENTIAL
HCT: 38 — AB (ref 41–53)
Hemoglobin: 13 — AB (ref 13.5–17.5)
Platelets: 166 10*3/uL (ref 150–400)
WBC: 4

## 2024-04-30 LAB — HEPATIC FUNCTION PANEL
ALT: 15 U/L (ref 10–40)
AST: 17 (ref 14–40)
Alkaline Phosphatase: 34 (ref 25–125)
Bilirubin, Total: 0.4

## 2024-04-30 LAB — CBC: RBC: 3.86 — AB (ref 3.87–5.11)

## 2024-05-01 ENCOUNTER — Other Ambulatory Visit: Payer: Self-pay | Admitting: Cardiology

## 2024-05-04 ENCOUNTER — Ambulatory Visit (INDEPENDENT_AMBULATORY_CARE_PROVIDER_SITE_OTHER)

## 2024-05-04 DIAGNOSIS — I495 Sick sinus syndrome: Secondary | ICD-10-CM

## 2024-05-05 ENCOUNTER — Encounter: Admitting: Internal Medicine

## 2024-05-05 LAB — CUP PACEART REMOTE DEVICE CHECK
Battery Impedance: 3742 Ohm
Battery Remaining Longevity: 13 mo
Battery Voltage: 2.7 V
Brady Statistic AP VP Percent: 100 %
Brady Statistic AP VS Percent: 0 %
Brady Statistic AS VP Percent: 0 %
Brady Statistic AS VS Percent: 0 %
Date Time Interrogation Session: 20250519113555
Implantable Lead Connection Status: 753985
Implantable Lead Connection Status: 753985
Implantable Lead Implant Date: 20160303
Implantable Lead Implant Date: 20160303
Implantable Lead Location: 753859
Implantable Lead Location: 753860
Implantable Lead Model: 5076
Implantable Lead Model: 5092
Implantable Pulse Generator Implant Date: 20160303
Lead Channel Impedance Value: 497 Ohm
Lead Channel Impedance Value: 668 Ohm
Lead Channel Pacing Threshold Amplitude: 0.625 V
Lead Channel Pacing Threshold Amplitude: 1 V
Lead Channel Pacing Threshold Pulse Width: 0.4 ms
Lead Channel Pacing Threshold Pulse Width: 0.4 ms
Lead Channel Setting Pacing Amplitude: 2 V
Lead Channel Setting Pacing Amplitude: 2.5 V
Lead Channel Setting Pacing Pulse Width: 0.4 ms
Lead Channel Setting Sensing Sensitivity: 2 mV
Zone Setting Status: 755011
Zone Setting Status: 755011

## 2024-05-07 ENCOUNTER — Ambulatory Visit: Payer: Self-pay | Admitting: Cardiology

## 2024-05-12 ENCOUNTER — Non-Acute Institutional Stay: Admitting: Internal Medicine

## 2024-05-12 ENCOUNTER — Encounter: Payer: Self-pay | Admitting: Internal Medicine

## 2024-05-12 VITALS — BP 118/84 | HR 65 | Temp 98.0°F | Resp 65 | Ht 74.0 in | Wt 184.6 lb

## 2024-05-12 DIAGNOSIS — E1165 Type 2 diabetes mellitus with hyperglycemia: Secondary | ICD-10-CM | POA: Diagnosis not present

## 2024-05-12 DIAGNOSIS — H9313 Tinnitus, bilateral: Secondary | ICD-10-CM

## 2024-05-12 DIAGNOSIS — M545 Low back pain, unspecified: Secondary | ICD-10-CM | POA: Diagnosis not present

## 2024-05-12 DIAGNOSIS — I959 Hypotension, unspecified: Secondary | ICD-10-CM

## 2024-05-12 DIAGNOSIS — G8929 Other chronic pain: Secondary | ICD-10-CM

## 2024-05-13 NOTE — Progress Notes (Signed)
 Remote pacemaker transmission.

## 2024-05-13 NOTE — Addendum Note (Signed)
 Addended by: Lott Rouleau A on: 05/13/2024 09:13 AM   Modules accepted: Orders

## 2024-05-17 NOTE — Progress Notes (Signed)
 Location: [facility] features editor of Service:  Clinic (12)  Provider:   Code Status:  Goals of Care:     04/14/2024    8:16 AM  Advanced Directives  Does Patient Have a Medical Advance Directive? Yes  Type of Estate agent of [place];Living will;Out of facility DNR (pink MOST or yellow form)  Copy of Healthcare Power of Attorney in Chart? No - copy requested     Chief Complaint  Patient presents with   Follow-up    1 week follow up    HPI: Patient is a 78 y.o. male seen today for an acute visit for Follow up Recent Move to [facility]    Recent Admission to the hospital 04/15-04/17 for UTI with Sepsis  Patient came to weeks ago feeling dizzy and weak. His Jardiance  which was 10 mg initially was changed to 25 in the hospital. I DC'd his lisinopril and changed his Jardiance  to 10 mg Patient came for follow-up today  Discussed the use of AI scribe software for clinical note transcription with the patient, who gave verbal consent to proceed.  History of Present Illness   [name] is a 78 year old male who presents for a follow-up visit.  His glucose levels remain stable on a reduced dose of Jardiance . [name] is no longer taking lisinopril and feels back to normal.  [name] feels well with no dizziness or weakness.  [name] experiences back pain described as a sensation of being 'punched' in the back, primarily during movement or exercise. Acetaminophen  at 650 mg, two capsules twice a day, alleviates the pain. [name] has not tried reducing the dose.  [name] has persistent tinnitus in both ears without significant hearing loss. [name] has not seen an ENT specialist. [name] has been on sertraline  for depression for two to three years, with possible worsening of tinnitus over time. His wife has noticed mood changes, prompting the start of sertraline .  [name] eats well, exercises by walking, and denies alcohol use. [name] reports good sleep and no significant changes in mood or  energy levels.        Patient has h/o Diabetes Type 2  PAF On Flecainide  and Pradaxa  Follows with Dr Marven Slimmer   S/p PPM for SSS  Past Medical History:  Diagnosis Date   Arthritis    Diabetes mellitus    exercise and diet controlled   Encounter for care of pacemaker    Hypertension    Paroxysmal atrial fibrillation [facility])    Sick sinus syndrome [facility])     Past Surgical History:  Procedure Laterality Date   JOINT REPLACEMENT  09/2011   left total knee   JOINT REPLACEMENT  06/2006   right total knee   KNEE CLOSED REDUCTION  12/05/2011   Procedure: CLOSED MANIPULATION KNEE;  Surgeon: Aurther Blue;  Location: [facility] ORS;  Service: Orthopedics;  Laterality: Left;   PACEMAKER INSERTION  02/17/2015   MDT Adapta implanted by Dr Bernestine Brighter in [place]  for sick sinus syndrome    Allergies  Allergen Reactions   Atorvastatin     myalgias    Outpatient Encounter Medications as of 05/12/2024  Medication Sig   acetaminophen  (TYLENOL ) 650 MG CR tablet Take 650 mg by mouth. 1 tab BID   Cholecalciferol (D3 SUPER STRENGTH) 50 MCG (2000 UT) CAPS Take by mouth daily.   Coenzyme Q-10 200 MG CAPS Take 1 capsule by mouth daily.   Continuous Glucose Sensor (DEXCOM G6 SENSOR) MISC Inject into the  skin. 1 sub-q every 10 days   dabigatran  (PRADAXA ) 150 MG CAPS capsule TAKE 1 CAPSULE BY MOUTH TWICE A DAY   empagliflozin  (JARDIANCE ) 10 MG TABS tablet Take 1 tablet (10 mg total) by mouth daily before breakfast.   flecainide  (TAMBOCOR ) 100 MG tablet TAKE 1 TABLET TWICE DAILY   levocetirizine (XYZAL) 5 MG tablet Take 1 tablet by mouth daily.   metoprolol  tartrate (LOPRESSOR ) 50 MG tablet TAKE 1 TABLET BY MOUTH TWICE A DAY   psyllium (METAMUCIL) 28 % packet Take 1 packet by mouth daily.   rosuvastatin (CRESTOR) 10 MG tablet Take 10 mg by mouth daily.   sertraline  (ZOLOFT ) 100 MG tablet Take 50 mg by mouth daily.   Turmeric (QC TUMERIC COMPLEX PO) Take 2,400 mg by mouth.   No facility-administered  encounter medications on file as of 05/12/2024.    Review of Systems:  Review of Systems  Constitutional:  Negative for activity change, appetite change and unexpected weight change.  HENT: Negative.    Respiratory:  Negative for cough and shortness of breath.   Cardiovascular:  Negative for leg swelling.  Gastrointestinal:  Negative for constipation.  Genitourinary:  Negative for frequency.  Musculoskeletal:  Positive for back pain. Negative for arthralgias, gait problem and myalgias.  Skin: Negative.  Negative for rash.  Neurological:  Negative for dizziness and weakness.  Psychiatric/Behavioral:  Negative for confusion and sleep disturbance.   All other systems reviewed and are negative.   Health Maintenance  Topic Date Due   Diabetic kidney evaluation - Urine ACR  Never done   Hepatitis C Screening  Never done   Pneumonia Vaccine 39+ Years old (1 of 2 - PCV) Never done   COVID-19 Vaccine (7 - Moderna risk 2024-25 season) 10/16/2024 (Originally 03/19/2024)   INFLUENZA VACCINE  07/17/2024   Medicare Annual Wellness (AWV)  11/19/2024   Diabetic kidney evaluation - eGFR measurement  04/30/2025   DTaP/Tdap/Td (2 - Td or Tdap) 12/25/2032   Zoster Vaccines- Shingrix  Completed   HPV VACCINES  Aged Out   Meningococcal B Vaccine  Aged Out    Physical Exam: Vitals:   05/12/24 0921  BP: 118/84  Pulse: 65  Resp: (!) 65  Temp: 98 F (36.7 C)  SpO2: 97%  Weight: 184 lb 9.6 oz (83.7 kg)  Height: 6\' 2"  (1.88 m)   Body mass index is 23.7 kg/m. Physical Exam Vitals reviewed.  Constitutional:      Appearance: Normal appearance.  HENT:     Head: Normocephalic.     Right Ear: Tympanic membrane normal.     Left Ear: Tympanic membrane normal.     Ears:     Comments: Small amount of wax both ears    Nose: Nose normal.     Mouth/Throat:     Mouth: Mucous membranes are moist.     Pharynx: Oropharynx is clear.  Eyes:     Pupils: Pupils are equal, round, and reactive to light.   Cardiovascular:     Rate and Rhythm: Normal rate and regular rhythm.     Pulses: Normal pulses.     Heart sounds: No murmur heard. Pulmonary:     Effort: Pulmonary effort is normal. No respiratory distress.     Breath sounds: Normal breath sounds. No rales.  Abdominal:     General: Abdomen is flat. Bowel sounds are normal.     Palpations: Abdomen is soft.  Musculoskeletal:        General: No swelling.  Cervical back: Neck supple.  Skin:    General: Skin is warm.  Neurological:     General: No focal deficit present.     Mental Status: [name] is alert and oriented to person, place, and time.  Psychiatric:        Mood and Affect: Mood normal.        Thought Content: Thought content normal.     Labs reviewed: Basic Metabolic Panel: Recent Labs    03/31/24 1018 04/01/24 0450 04/02/24 0426 04/30/24 0000  NA 133* 134* 138 134*  K 4.4 4.1 4.2 4.4  CL 101 105 111 101  CO2 19* 18* 20* 20  GLUCOSE 222* 115* 95  --   BUN 25* 21 15 18   CREATININE 1.20 1.25* 0.99 0.9  CALCIUM 9.4 8.4* 8.2* 9.2  TSH  --  1.226  --   --    Liver Function Tests: Recent Labs    02/28/24 1629 03/31/24 1018 04/30/24 0000  AST 16 14* 17  ALT 18 20 15   ALKPHOS 26* 30* 34  BILITOT 0.8 0.5  --   PROT 5.9* 7.1  --   ALBUMIN 3.4* 3.8 4.0   No results for input(s): "LIPASE", "AMYLASE" in the last 8760 hours. No results for input(s): "AMMONIA" in the last 8760 hours. CBC: Recent Labs    02/28/24 1629 03/31/24 1018 04/01/24 0450 04/02/24 0426 04/30/24 0000  WBC 8.8 9.9 7.1 5.6 4.0  NEUTROABS 7.2  --   --   --   --   HGB 12.4* 11.8* 10.0* 9.0* 13.0*  HCT 35.9* 33.4* 28.2* 25.8* 38*  MCV 95.5 94.4 93.7 94.5  --   PLT 136* 232 208 211 166   Lipid Panel: No results for input(s): "CHOL", "HDL", "LDLCALC", "TRIG", "CHOLHDL", "LDLDIRECT" in the last 8760 hours. Lab Results  Component Value Date   HGBA1C 5.7 04/30/2024    Procedures since last visit: CUP PACEART REMOTE DEVICE CHECK Result  Date: 05/05/2024 PPM Scheduled remote reviewed. Normal device function.  Presenting rhythm:  AP-VP. Battery estimated at 13 months, min <1 month remaining longevity. Next remote 91 days.  Continue monthly battery checks until ERI. - CS, CVRS   Assessment/Plan   Assessment and Plan    Tinnitus Chronic bilateral tinnitus for over ten years. Possible sertraline  link, but tinnitus predates use. Reducing sertraline  may help. - Reduce sertraline  dose to 50 mg daily. - Consider ENT referral if symptoms worsen. - Schedule ear wax removal with [facility].  Depression Managed with sertraline  100 mg daily. Moodiness noted. Plan to reduce sertraline  to assess impact on tinnitus, mood, and potential hyponatremia. - Reduce sertraline  dose to 50 mg daily. - Monitor mood and tinnitus symptoms. - Research alternative medications if needed.  Hyponatremia Mild hyponatremia, likely from dehydration. Current sodium levels slightly low. Monitoring planned. - Monitor sodium levels in follow-up blood work.  Type 2 diabetes mellitus Well-controlled with A1c of 5.7%. Current glucose management appropriate. - Continue current diabetes management. - Monitor glucose levels regularly.  Back pain Chronic pain relieved by acetaminophen  650 mg twice daily. Consider reducing dosage to minimize side effects. - Reduce acetaminophen  to 650 mg once daily if possible. - Monitor pain levels and adjust acetaminophen  dosage as needed.       Complicated UTI (urinary tract infection) (Primary) Admitted in the hospital with sepsis If Symptoms return will need to see urology CT in the hospital showed Normal Kidneys   Sick sinus syndrome (HCC) S/P PPM follow with Cardiology  Mixed hyperlipidemia Statin LDL 61  Paroxysmal atrial fibrillation (HCC) On Metoprolol  , Tambocor  and Pradaxa  No signs of bleeding   HLD On statin  HTN Metoprolol  Took him off lisinopril Can restart later at lower dose    Labs/tests  ordered:  Next appt:  07/06/2024

## 2024-05-26 ENCOUNTER — Other Ambulatory Visit: Payer: Self-pay | Admitting: Orthopedic Surgery

## 2024-05-26 DIAGNOSIS — E1165 Type 2 diabetes mellitus with hyperglycemia: Secondary | ICD-10-CM

## 2024-05-26 MED ORDER — EMPAGLIFLOZIN 10 MG PO TABS: 10.0000 mg | ORAL_TABLET | Freq: Every day | ORAL | 1 refills | Status: AC

## 2024-05-29 ENCOUNTER — Encounter: Payer: Self-pay | Admitting: Internal Medicine

## 2024-06-01 MED ORDER — DEXCOM G7 SENSOR MISC: 1.0000 | 12 refills | Status: AC

## 2024-06-01 MED ORDER — DEXCOM G7 RECEIVER DEVI
1.0000 | Freq: Every day | 0 refills | Status: AC
Start: 2024-06-01 — End: ?

## 2024-06-02 ENCOUNTER — Encounter: Payer: Self-pay | Admitting: Internal Medicine

## 2024-06-04 ENCOUNTER — Ambulatory Visit (INDEPENDENT_AMBULATORY_CARE_PROVIDER_SITE_OTHER)

## 2024-06-04 ENCOUNTER — Other Ambulatory Visit: Payer: Self-pay | Admitting: Internal Medicine

## 2024-06-04 DIAGNOSIS — I495 Sick sinus syndrome: Secondary | ICD-10-CM

## 2024-06-04 DIAGNOSIS — N480 Leukoplakia of penis: Secondary | ICD-10-CM

## 2024-06-04 NOTE — Progress Notes (Signed)
 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

## 2024-06-05 LAB — CUP PACEART REMOTE DEVICE CHECK
Battery Impedance: 4020 Ohm
Battery Remaining Longevity: 12 mo
Battery Voltage: 2.7 V
Brady Statistic AP VP Percent: 100 %
Brady Statistic AP VS Percent: 0 %
Brady Statistic AS VP Percent: 0 %
Brady Statistic AS VS Percent: 0 %
Date Time Interrogation Session: 20250619095127
Implantable Lead Connection Status: 753985
Implantable Lead Connection Status: 753985
Implantable Lead Implant Date: 20160303
Implantable Lead Implant Date: 20160303
Implantable Lead Location: 753859
Implantable Lead Location: 753860
Implantable Lead Model: 5076
Implantable Lead Model: 5092
Implantable Pulse Generator Implant Date: 20160303
Lead Channel Impedance Value: 535 Ohm
Lead Channel Impedance Value: 726 Ohm
Lead Channel Pacing Threshold Amplitude: 0.5 V
Lead Channel Pacing Threshold Amplitude: 1 V
Lead Channel Pacing Threshold Pulse Width: 0.4 ms
Lead Channel Pacing Threshold Pulse Width: 0.4 ms
Lead Channel Setting Pacing Amplitude: 2 V
Lead Channel Setting Pacing Amplitude: 2.5 V
Lead Channel Setting Pacing Pulse Width: 0.4 ms
Lead Channel Setting Sensing Sensitivity: 2 mV
Zone Setting Status: 755011
Zone Setting Status: 755011

## 2024-06-06 ENCOUNTER — Encounter: Payer: Self-pay | Admitting: Internal Medicine

## 2024-06-08 NOTE — Telephone Encounter (Signed)
Pended Rx and sent to Dr. Gupta for approval.  

## 2024-06-11 MED ORDER — DEXCOM G7 SENSOR MISC: 1 refills | Status: AC

## 2024-06-22 NOTE — Progress Notes (Signed)
 Subjective Patient ID: [name] is a 78 y.o. male. Medication check -            Chief Complaint  Patient presents with  . A Fib  . Depression  . Diabetes  . Hyperlipidemia  Immunizations: up to date  Colonoscopy: 6/20 repeat 5 years Eye exam 10/24 Foot exam:12/24 Micral: today      Major Depression in remission - he is happy with Sertaline dosage he is taking 50 mg dosage      Diabetes  He presents for his follow-up diabetic visit. He has type 2 diabetes mellitus. His disease course has been stable. Pertinent negatives for hypoglycemia include no dizziness or sweats. Pertinent negatives for diabetes include no blurred vision and no chest pain. Symptoms are stable. Risk factors for coronary artery disease include hypertension, male sex and dyslipidemia. Current diabetic treatment includes Jardiance  . His weight is stable. He is following a generally healthy diet. Meal planning includes avoidance of concentrated sweets. He participates in exercise three times a week. he is on-An ACE inhibitor/angiotensin II receptor blocker is being taken. Eye exam is current.  His aic is higher than last check          Hypertension  This is a chronic problem. The current episode started more than 1 year ago. The problem is unchanged. The problem is controlled. Pertinent negatives include no blurred vision, chest pain, shortness of breath or sweats. Risk factors for coronary artery disease include male gender. Past treatments include beta blockers and ACE inhibitors. The current treatment provides significant improvement. There are no compliance problems.   His metoprolol  was decreased to 50 mg bid    Patient sees cardiology ([facility])   A Fib/sick sinus syndrome/Pacemaker in place - on Flecainide  and Pradaxa  and Metoprolol  -Dr Hiram office will be monitoring his pacemaker -he will follow up yearly  He states he has another year on his pacemaker -    Hyperlipidemia - taking  Crestor ( he has been on several statins ) and Co Q 10 =his cholesterol levels have been good since back on a statin - denies chest pain or shortness of breath  CHA2DS2-VASc Score: 4   .   Lab Results  Component Value Date   CHOL 116 11/19/2023   CHOL 128 05/09/2023   CHOL 110 11/06/2022   Lab Results  Component Value Date   HDL 30 (L) 11/19/2023   HDL 36 (L) 05/09/2023   HDL 31 (L) 11/06/2022   Lab Results  Component Value Date   LDLCALC 61 11/19/2023   LDLCALC 68 05/09/2023   Lab Results  Component Value Date   TRIG 175 (H) 11/19/2023   TRIG 166 (H) 05/09/2023   TRIG 132 11/06/2022   No results found for: CHOLHDL       Medical History[1] Family History[2] Social History[3] Surgical History[4] Current Medications[5] Allergies[6] Patient Active Problem List   Diagnosis Date Noted  . Recurrent major depressive disorder, in full remission (HCC) 04/24/2021  . Encounter for care of pacemaker 10/21/2019  . Benign hypertension 10/09/2016  . Diabetes mellitus without complication (HCC) 10/09/2016  . Hyperlipemia 10/09/2016  . Pacemaker 10/09/2016  . Paroxysmal atrial fibrillation (HCC) 03/23/2015  . Sick sinus syndrome (HCC) 03/23/2015  . 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 Additionally, he had a urinary  tract infection (UTI) and influenza. His diabetes is managed with diet, exercise, and Jardiance  10 mg, which he reports as effective. He uses a Dexcom G7 for glucose monitoring, which provides him with his A1c level after 10 days. He is due for blood work today and has a 90-day supply of Jardiance . His last blood work was conducted three months ago, following his use of Ozempic, and showed an A1c level of 5.7. He anticipates that his A1c level may have increased since then, as  his Dexcom reading was 130 today. He aims to keep his A1c level below 7. He walks to the main building for dining and other activities, which helps him maintain his blood sugar levels.  He has been experiencing tinnitus, which he believes may be a side effect of sertraline . Dr. Charlanne suggested halving the dose to see if it would help, and it seems to be working better. The ringing in his ears is not significantly improved, but he is fine with the current dosage. He is currently taking 50 mg of sertraline  instead of 100 mg.  He is up to date with his immunizations and has resumed exercising regularly. He has not had any recent fevers, chills, or coughs. He is taking additional vitamin D (2000 IU) but not B12.  He had a colonoscopy in 2020 and was told by Dr. Kristie that he would need another one in five years. He has not heard from his GI doctor about it.   The following information was reviewed by members of the visit team:  Tobacco  Allergies  Meds      HPI  Review of Systems  Objective Vitals:   06/22/24 1046  BP: 110/72  Pulse: 88  Resp: 16  Temp: 97.7 F (36.5 C)  TempSrc: Oral  SpO2: 95%  Weight: 85.5 kg (188 lb 9.6 oz)  Height: 1.88 m (6' 2)    Physical Exam Vitals and nursing note reviewed.  Constitutional:      General: He is not in acute distress.    Appearance: Normal appearance. He is not ill-appearing.  HENT:     Head: Normocephalic.     Right Ear: Tympanic membrane normal.     Left Ear: Tympanic membrane normal.     Mouth/Throat:     Mouth: Mucous membranes are moist.   Eyes:     General: No scleral icterus.    Conjunctiva/sclera: Conjunctivae normal.    Cardiovascular:     Rate and Rhythm: Normal rate.     Comments: Few pvc's Pulmonary:     Effort: Pulmonary effort is normal.     Breath sounds: Normal breath sounds.  Lymphadenopathy:     Cervical: No cervical adenopathy.   Skin:    General: Skin is warm.   Neurological:     General: No focal  deficit present.     Mental Status: He is alert. Mental status is at baseline.   Psychiatric:        Mood and Affect: Mood normal.        Behavior: Behavior normal.        Thought Content: Thought content normal.   I have personally spent 42 minutes involved in face-to-face and non-face-to-face activities for this patient on the day of the visit.  Professional time spent includes the following activities, in addition to those noted in the documentation:   - preparing to see the patient (e.g., review of recent and/or remote lab/imaging/study results, provider notes, and patient messages/phone calls available in  current EMR, CareEverywhere, and scanned records) -obtaining and/or reviewing separately obtained history either through past provider notes, patient phone calls, and/or patient's family member(s)/caregiver(s) -performing a medically appropriate examination and/or evaluation -counseling and educating the patient/family/caregiver -ordering medications, tests, or procedures -documenting clinical information in the electronic or other health record -reviewing most up to date studies or expert consensus guidelines for screening/diagnosing/treating pertinent conditions/symptoms -independently interpreting results (not separately reported) and communicating results to the patient/family/caregiver -care coordination (not separately reported) -referring and communicating with other health care professionals (when not separately reported)      Assessment/Plan Diagnoses and all orders for this visit:  Type 2 diabetes mellitus without complication, without long-term current use of insulin     (CMD) -     Hemoglobin A1C With Estimated Average Glucose -     TSH  Recurrent major depressive disorder, in full remission -     sertraline  (ZOLOFT ) 100 mg tablet; Take one pill per day -     TSH  Hypercholesterolemia -     CBC with Differential -     Comprehensive Metabolic Panel -     Lipid  Panel  Essential (primary) hypertension  Vitamin D deficiency, unspecified -     Vitamin D, 25-Hydroxy  Paroxysmal atrial fibrillation    (CMD) -     TSH -     Vitamin B12  Other orders -     HM HISTORICAL DIABETES FOOT EXAM -     empagliflozin  (JARDIANCE ) 10 mg tab; Take by mouth daily. -     blood-glucose sensor (Dexcom G7 Sensor); Use as directed to monitor blood sugar levels. Follow package directions for replacement.   Problem List Items Addressed This Visit     Paroxysmal atrial fibrillation (HCC)   Relevant Orders   TSH   Vitamin B12   Recurrent major depressive disorder, in full remission (HCC)   Relevant Medications   sertraline  (ZOLOFT ) 100 mg tablet   Other Relevant Orders   TSH   Other Visit Diagnoses       Type 2 diabetes mellitus without complication, without long-term current use of insulin     (CMD)    -  Primary   Relevant Orders   Hemoglobin A1C With Estimated Average Glucose   TSH     Hypercholesterolemia       Relevant Orders   CBC with Differential   Comprehensive Metabolic Panel   Lipid Panel     Essential (primary) hypertension         Vitamin D deficiency, unspecified       Relevant Orders   Vitamin D, 25-Hydroxy       Assessment & Plan 1. Medication management.History of Atrial Fibrillation and Sick Sinus Syndrome  - Currently taking flecainide , Pradaxa , metoprolol  50 mg twice a day, sertraline  50 mg daily, rosuvastatin, Jardiance  10 mg daily, lysine, Xyzal, and vitamin D 2000 IU daily. - Reports feeling good on his current regimen. - No changes to medications are needed at this time. - Will continue using the Dexcom G7 system for glucose monitoring.  2. Diabetes mellitus. - Currently on Jardiance  10 mg daily and reports it is working well. - Uses the Dexcom G7 system for glucose monitoring. - Last A1c was 5.7 three months ago, but believes it may be higher now. - Blood work will be conducted today to assess current A1c levels. - He  has a healthy diet. Will check vitamin b 12 level today.  3. Tinnitus. - Reports experiencing ringing in his  ears, which improved after reducing the sertraline  dosage from 100 mg to 50 mg daily. - Will continue with the current dosage of sertraline  50 mg daily. - No significant changes in symptoms recently.  4. Health maintenance. - Up to date with RSV, tetanus, shingles, and pneumonia vaccines. - Will receive the influenza vaccine in the fall. - Advised to contact GI doctor to determine if a colonoscopy is necessary, as he is at the five-year mark since his last one in 2020. - Blood work will be conducted today to assess vitamin D and B12 levels.  5.Hyperlipidemia - will check lipid panel today - continue with crestor.      Electronically signed: Kristen Diane Kaplan, PA-C 06/22/2024  11:04 AM        [1] Past Medical History: Diagnosis Date  . Depression   . Diabetes mellitus    (CMD)   . Hypercholesterolemia   . Hypertension   [2] Family History Problem Relation Name Age of Onset  . Hypertension Mother    . Breast cancer Mother    . Heart disease Father    . Hypertension Father    . Colon cancer Father    . Prostate cancer Father    . Diabetes Maternal Grandmother    [3] Social History Socioeconomic History  . Marital status: Married  Tobacco Use  . Smoking status: Never  . Smokeless tobacco: Never  Vaping Use  . Vaping status: Never Used  Substance and Sexual Activity  . Alcohol use: Yes    Comment: rare social   Social Drivers of Health   Food Insecurity: Low Risk  (04/03/2024)   Food vital sign   . Within the past 12 months, you worried that your food would run out before you got money to buy more: Never true   . Within the past 12 months, the food you bought just didn't last and you didn't have money to get more: Never true  Transportation Needs: No Transportation Needs (04/03/2024)   Transportation   . In the past 12 months, has lack of reliable  transportation kept you from medical appointments, meetings, work or from getting things needed for daily living? : No  Safety: Low Risk  (04/03/2024)   Safety   . How often does anyone, including family and friends, physically hurt you?: Never   . How often does anyone, including family and friends, insult or talk down to you?: Never   . How often does anyone, including family and friends, threaten you with harm?: Never   . How often does anyone, including family and friends, scream or curse at you?: Never  Living Situation: Low Risk  (04/03/2024)   Living Situation   . What is your living situation today?: I have a steady place to live   . Think about the place you live. Do you have problems with any of the following? Choose all that apply:: None/None on this list  [4] Past Surgical History: Procedure Laterality Date  . OTHER SURGICAL HISTORY     Procedure: OTHER SURGICAL HISTORY (Visual merchandiser)  . REPLACEMENT TOTAL KNEE BILATERAL     Procedure: REPLACEMENT TOTAL KNEE BILATERAL  [5]  Current Outpatient Medications:  .  coenzyme Q-10 200 mg capsule, , Disp: , Rfl:  .  dabigatran  etexilate (PRADAXA ) 150 mg capsule, Take  by mouth 2 (two) times a day., Disp: , Rfl:  .  empagliflozin  (JARDIANCE ) 10 mg tab, Take by mouth daily., Disp: , Rfl:  .  flecainide  (TAMBOCOR )  50 mg tablet, 100 mg 2 (two) times a day., Disp: , Rfl:  .  glucose blood (True Metrix Glucose Test Strip) test strip, Use as instructed, Disp: 200 strip, Rfl: 0 .  Lancets misc, 1 each by miscellaneous route 2 (two) times a day., Disp: 100 each, Rfl: 0 .  levocetirizine (XYZAL) 5 mg tablet, TAKE 1 TABLET BY MOUTH EVERY DAY IN THE EVENING, Disp: 90 tablet, Rfl: 3 .  lysine 500 mg tab, Take 1 tablet by mouth Once Daily., Disp: , Rfl:  .  metoprolol  tartrate (LOPRESSOR ) 100 mg tablet, Take 100 mg by mouth 2 (two) times a day., Disp: , Rfl:  .  miscellaneous medical supply (C-Tub) misc, Accu-check freestyle lancets - Dx DM Type 2,  Disp: 100 each, Rfl: 5 .  rosuvastatin (CRESTOR) 10 mg tablet, TAKE 1 TABLET EVERY DAY, Disp: 90 tablet, Rfl: 3 .  blood-glucose sensor (Dexcom G7 Sensor), Use as directed to monitor blood sugar levels. Follow package directions for replacement., Disp: , Rfl:  .  sertraline  (ZOLOFT ) 100 mg tablet, Take one pill per day, Disp: , Rfl:  [6] Allergies Allergen Reactions  . Atorvastatin     myalgias

## 2024-06-25 NOTE — Addendum Note (Signed)
 Addended by: TAWNI DRILLING D on: 06/25/2024 09:10 AM   Modules accepted: Orders, Level of Service

## 2024-06-25 NOTE — Progress Notes (Signed)
 Remote pacemaker transmission.

## 2024-06-26 ENCOUNTER — Encounter: Payer: Self-pay | Admitting: Internal Medicine

## 2024-06-26 NOTE — Telephone Encounter (Signed)
 Removed Dr. Charlanne as PCP and forwarded to Dr. Charlanne.

## 2024-06-27 ENCOUNTER — Other Ambulatory Visit: Payer: Self-pay | Admitting: Cardiology

## 2024-07-06 ENCOUNTER — Ambulatory Visit: Payer: Medicare PPO

## 2024-07-06 ENCOUNTER — Ambulatory Visit

## 2024-07-06 ENCOUNTER — Encounter: Payer: Self-pay | Admitting: Adult Health

## 2024-07-07 LAB — CUP PACEART REMOTE DEVICE CHECK
Battery Impedance: 3868 Ohm
Battery Remaining Longevity: 13 mo
Battery Voltage: 2.69 V
Brady Statistic AP VP Percent: 97 %
Brady Statistic AP VS Percent: 0 %
Brady Statistic AS VP Percent: 3 %
Brady Statistic AS VS Percent: 0 %
Date Time Interrogation Session: 20250721112812
Implantable Lead Connection Status: 753985
Implantable Lead Connection Status: 753985
Implantable Lead Implant Date: 20160303
Implantable Lead Implant Date: 20160303
Implantable Lead Location: 753859
Implantable Lead Location: 753860
Implantable Lead Model: 5076
Implantable Lead Model: 5092
Implantable Pulse Generator Implant Date: 20160303
Lead Channel Impedance Value: 516 Ohm
Lead Channel Impedance Value: 833 Ohm
Lead Channel Pacing Threshold Amplitude: 0.625 V
Lead Channel Pacing Threshold Amplitude: 1 V
Lead Channel Pacing Threshold Pulse Width: 0.4 ms
Lead Channel Pacing Threshold Pulse Width: 0.4 ms
Lead Channel Setting Pacing Amplitude: 2 V
Lead Channel Setting Pacing Amplitude: 2.5 V
Lead Channel Setting Pacing Pulse Width: 0.4 ms
Lead Channel Setting Sensing Sensitivity: 2 mV
Zone Setting Status: 755011
Zone Setting Status: 755011

## 2024-08-06 ENCOUNTER — Ambulatory Visit (INDEPENDENT_AMBULATORY_CARE_PROVIDER_SITE_OTHER)

## 2024-08-06 DIAGNOSIS — I495 Sick sinus syndrome: Secondary | ICD-10-CM | POA: Diagnosis not present

## 2024-08-06 LAB — CUP PACEART REMOTE DEVICE CHECK
Battery Impedance: 4248 Ohm
Battery Remaining Longevity: 11 mo
Battery Voltage: 2.69 V
Brady Statistic AP VP Percent: 93 %
Brady Statistic AP VS Percent: 0 %
Brady Statistic AS VP Percent: 7 %
Brady Statistic AS VS Percent: 0 %
Date Time Interrogation Session: 20250821103319
Implantable Lead Connection Status: 753985
Implantable Lead Connection Status: 753985
Implantable Lead Implant Date: 20160303
Implantable Lead Implant Date: 20160303
Implantable Lead Location: 753859
Implantable Lead Location: 753860
Implantable Lead Model: 5076
Implantable Lead Model: 5092
Implantable Pulse Generator Implant Date: 20160303
Lead Channel Impedance Value: 542 Ohm
Lead Channel Impedance Value: 947 Ohm
Lead Channel Pacing Threshold Amplitude: 0.75 V
Lead Channel Pacing Threshold Amplitude: 1 V
Lead Channel Pacing Threshold Pulse Width: 0.4 ms
Lead Channel Pacing Threshold Pulse Width: 0.4 ms
Lead Channel Setting Pacing Amplitude: 2 V
Lead Channel Setting Pacing Amplitude: 2.5 V
Lead Channel Setting Pacing Pulse Width: 0.4 ms
Lead Channel Setting Sensing Sensitivity: 2 mV
Zone Setting Status: 755011
Zone Setting Status: 755011

## 2024-08-07 ENCOUNTER — Ambulatory Visit: Payer: Self-pay | Admitting: Cardiology

## 2024-08-12 NOTE — Addendum Note (Signed)
 Addended by: VICCI SELLER A on: 08/12/2024 12:52 PM   Modules accepted: Orders, Level of Service

## 2024-08-12 NOTE — Progress Notes (Signed)
 Remote pacemaker transmission.

## 2024-08-25 ENCOUNTER — Encounter: Admitting: Internal Medicine

## 2024-09-07 ENCOUNTER — Ambulatory Visit (INDEPENDENT_AMBULATORY_CARE_PROVIDER_SITE_OTHER)

## 2024-09-07 DIAGNOSIS — I495 Sick sinus syndrome: Secondary | ICD-10-CM | POA: Diagnosis not present

## 2024-09-07 LAB — CUP PACEART REMOTE DEVICE CHECK
Battery Impedance: 4248 Ohm
Battery Remaining Longevity: 11 mo
Battery Voltage: 2.69 V
Brady Statistic AP VP Percent: 91 %
Brady Statistic AP VS Percent: 0 %
Brady Statistic AS VP Percent: 9 %
Brady Statistic AS VS Percent: 0 %
Date Time Interrogation Session: 20250922105202
Implantable Lead Connection Status: 753985
Implantable Lead Connection Status: 753985
Implantable Lead Implant Date: 20160303
Implantable Lead Implant Date: 20160303
Implantable Lead Location: 753859
Implantable Lead Location: 753860
Implantable Lead Model: 5076
Implantable Lead Model: 5092
Implantable Pulse Generator Implant Date: 20160303
Lead Channel Impedance Value: 522 Ohm
Lead Channel Impedance Value: 951 Ohm
Lead Channel Pacing Threshold Amplitude: 0.875 V
Lead Channel Pacing Threshold Amplitude: 1 V
Lead Channel Pacing Threshold Pulse Width: 0.4 ms
Lead Channel Pacing Threshold Pulse Width: 0.4 ms
Lead Channel Setting Pacing Amplitude: 2 V
Lead Channel Setting Pacing Amplitude: 2.5 V
Lead Channel Setting Pacing Pulse Width: 0.4 ms
Lead Channel Setting Sensing Sensitivity: 2 mV
Zone Setting Status: 755011
Zone Setting Status: 755011

## 2024-09-09 ENCOUNTER — Ambulatory Visit: Payer: Self-pay | Admitting: Cardiology

## 2024-09-09 NOTE — Progress Notes (Signed)
 Remote PPM Transmission

## 2024-10-02 ENCOUNTER — Other Ambulatory Visit: Payer: Self-pay | Admitting: Cardiology

## 2024-10-02 DIAGNOSIS — I480 Paroxysmal atrial fibrillation: Secondary | ICD-10-CM

## 2024-10-02 NOTE — Telephone Encounter (Signed)
 Prescription refill request for Pradaxa  received.  Indication:afib Last office visit:4/25 Weight:83.7  kg Age:78 Scr:1.11  7/25 CrCl:64.93  ml/min  Prescription refilled

## 2024-10-05 ENCOUNTER — Ambulatory Visit: Payer: Medicare PPO

## 2024-10-08 ENCOUNTER — Ambulatory Visit

## 2024-10-08 ENCOUNTER — Encounter

## 2024-10-09 LAB — CUP PACEART REMOTE DEVICE CHECK
Battery Impedance: 4248 Ohm
Battery Remaining Longevity: 12 mo
Battery Voltage: 2.68 V
Brady Statistic AP VP Percent: 89 %
Brady Statistic AP VS Percent: 0 %
Brady Statistic AS VP Percent: 11 %
Brady Statistic AS VS Percent: 0 %
Date Time Interrogation Session: 20251024103133
Implantable Lead Connection Status: 753985
Implantable Lead Connection Status: 753985
Implantable Lead Implant Date: 20160303
Implantable Lead Implant Date: 20160303
Implantable Lead Location: 753859
Implantable Lead Location: 753860
Implantable Lead Model: 5076
Implantable Lead Model: 5092
Implantable Pulse Generator Implant Date: 20160303
Lead Channel Impedance Value: 1000 Ohm
Lead Channel Impedance Value: 553 Ohm
Lead Channel Pacing Threshold Amplitude: 0.875 V
Lead Channel Pacing Threshold Amplitude: 1 V
Lead Channel Pacing Threshold Pulse Width: 0.4 ms
Lead Channel Pacing Threshold Pulse Width: 0.4 ms
Lead Channel Setting Pacing Amplitude: 2 V
Lead Channel Setting Pacing Amplitude: 2.5 V
Lead Channel Setting Pacing Pulse Width: 0.4 ms
Lead Channel Setting Sensing Sensitivity: 2 mV
Zone Setting Status: 755011
Zone Setting Status: 755011

## 2024-11-04 ENCOUNTER — Other Ambulatory Visit: Payer: Self-pay | Admitting: Orthopedic Surgery

## 2024-11-04 DIAGNOSIS — E1165 Type 2 diabetes mellitus with hyperglycemia: Secondary | ICD-10-CM

## 2024-11-05 ENCOUNTER — Encounter

## 2024-11-05 ENCOUNTER — Ambulatory Visit

## 2024-11-08 ENCOUNTER — Ambulatory Visit: Attending: Cardiology

## 2024-11-09 ENCOUNTER — Encounter

## 2024-11-10 LAB — CUP PACEART REMOTE DEVICE CHECK
Battery Impedance: 4406 Ohm
Battery Remaining Longevity: 11 mo
Battery Voltage: 2.68 V
Brady Statistic AP VP Percent: 90 %
Brady Statistic AP VS Percent: 0 %
Brady Statistic AS VP Percent: 10 %
Brady Statistic AS VS Percent: 0 %
Date Time Interrogation Session: 20251125101904
Implantable Lead Connection Status: 753985
Implantable Lead Connection Status: 753985
Implantable Lead Implant Date: 20160303
Implantable Lead Implant Date: 20160303
Implantable Lead Location: 753859
Implantable Lead Location: 753860
Implantable Lead Model: 5076
Implantable Lead Model: 5092
Implantable Pulse Generator Implant Date: 20160303
Lead Channel Impedance Value: 539 Ohm
Lead Channel Impedance Value: 980 Ohm
Lead Channel Pacing Threshold Amplitude: 0.875 V
Lead Channel Pacing Threshold Amplitude: 1 V
Lead Channel Pacing Threshold Pulse Width: 0.4 ms
Lead Channel Pacing Threshold Pulse Width: 0.4 ms
Lead Channel Setting Pacing Amplitude: 2 V
Lead Channel Setting Pacing Amplitude: 2.5 V
Lead Channel Setting Pacing Pulse Width: 0.4 ms
Lead Channel Setting Sensing Sensitivity: 2 mV
Zone Setting Status: 755011
Zone Setting Status: 755011

## 2024-12-06 ENCOUNTER — Ambulatory Visit

## 2024-12-09 ENCOUNTER — Ambulatory Visit

## 2024-12-09 DIAGNOSIS — I495 Sick sinus syndrome: Secondary | ICD-10-CM | POA: Diagnosis not present

## 2024-12-11 ENCOUNTER — Encounter

## 2024-12-11 LAB — CUP PACEART REMOTE DEVICE CHECK
Battery Impedance: 4496 Ohm
Battery Remaining Longevity: 10 mo
Battery Voltage: 2.68 V
Brady Statistic AP VP Percent: 91 %
Brady Statistic AP VS Percent: 0 %
Brady Statistic AS VP Percent: 9 %
Brady Statistic AS VS Percent: 0 %
Date Time Interrogation Session: 20251225091128
Implantable Lead Connection Status: 753985
Implantable Lead Connection Status: 753985
Implantable Lead Implant Date: 20160303
Implantable Lead Implant Date: 20160303
Implantable Lead Location: 753859
Implantable Lead Location: 753860
Implantable Lead Model: 5076
Implantable Lead Model: 5092
Implantable Pulse Generator Implant Date: 20160303
Lead Channel Impedance Value: 1004 Ohm
Lead Channel Impedance Value: 526 Ohm
Lead Channel Pacing Threshold Amplitude: 1 V
Lead Channel Pacing Threshold Amplitude: 1.25 V
Lead Channel Pacing Threshold Pulse Width: 0.4 ms
Lead Channel Pacing Threshold Pulse Width: 0.4 ms
Lead Channel Setting Pacing Amplitude: 2 V
Lead Channel Setting Pacing Amplitude: 2.5 V
Lead Channel Setting Pacing Pulse Width: 0.4 ms
Lead Channel Setting Sensing Sensitivity: 2 mV
Zone Setting Status: 755011
Zone Setting Status: 755011

## 2024-12-11 NOTE — Progress Notes (Signed)
 Remote PPM Transmission

## 2024-12-13 ENCOUNTER — Ambulatory Visit: Payer: Self-pay | Admitting: Cardiology

## 2025-01-04 ENCOUNTER — Ambulatory Visit: Payer: Medicare PPO

## 2025-01-06 ENCOUNTER — Ambulatory Visit

## 2025-01-09 ENCOUNTER — Ambulatory Visit: Attending: Cardiology

## 2025-01-11 ENCOUNTER — Encounter

## 2025-01-13 LAB — CUP PACEART REMOTE DEVICE CHECK
Battery Impedance: 4458 Ohm
Battery Remaining Longevity: 11 mo
Battery Voltage: 2.67 V
Brady Statistic AP VP Percent: 92 %
Brady Statistic AP VS Percent: 0 %
Brady Statistic AS VP Percent: 8 %
Brady Statistic AS VS Percent: 0 %
Date Time Interrogation Session: 20260127104818
Implantable Lead Connection Status: 753985
Implantable Lead Connection Status: 753985
Implantable Lead Implant Date: 20160303
Implantable Lead Implant Date: 20160303
Implantable Lead Location: 753859
Implantable Lead Location: 753860
Implantable Lead Model: 5076
Implantable Lead Model: 5092
Implantable Pulse Generator Implant Date: 20160303
Lead Channel Impedance Value: 532 Ohm
Lead Channel Impedance Value: 966 Ohm
Lead Channel Pacing Threshold Amplitude: 1 V
Lead Channel Pacing Threshold Amplitude: 1.125 V
Lead Channel Pacing Threshold Pulse Width: 0.4 ms
Lead Channel Pacing Threshold Pulse Width: 0.4 ms
Lead Channel Setting Pacing Amplitude: 2 V
Lead Channel Setting Pacing Amplitude: 2.5 V
Lead Channel Setting Pacing Pulse Width: 0.4 ms
Lead Channel Setting Sensing Sensitivity: 2 mV
Zone Setting Status: 755011
Zone Setting Status: 755011

## 2025-01-17 ENCOUNTER — Ambulatory Visit: Payer: Self-pay | Admitting: Cardiology

## 2025-02-04 ENCOUNTER — Encounter

## 2025-02-06 ENCOUNTER — Ambulatory Visit

## 2025-02-09 ENCOUNTER — Ambulatory Visit

## 2025-02-11 ENCOUNTER — Encounter

## 2025-03-12 ENCOUNTER — Ambulatory Visit

## 2025-03-15 ENCOUNTER — Encounter

## 2025-04-12 ENCOUNTER — Ambulatory Visit

## 2025-05-13 ENCOUNTER — Ambulatory Visit

## 2025-06-11 ENCOUNTER — Ambulatory Visit

## 2025-09-10 ENCOUNTER — Ambulatory Visit
